# Patient Record
Sex: Female | Born: 1975 | Race: Black or African American | Hispanic: No | Marital: Single | Smoking: Current every day smoker
Health system: Southern US, Community
[De-identification: ages and names within clinical notes are randomized; demographics above are authoritative.]

## PROBLEM LIST (undated history)

## (undated) DIAGNOSIS — I10 Essential (primary) hypertension: Secondary | ICD-10-CM

## (undated) DIAGNOSIS — I5032 Chronic diastolic (congestive) heart failure: Secondary | ICD-10-CM

## (undated) DIAGNOSIS — D509 Iron deficiency anemia, unspecified: Secondary | ICD-10-CM

## (undated) DIAGNOSIS — E785 Hyperlipidemia, unspecified: Secondary | ICD-10-CM

## (undated) DIAGNOSIS — N939 Abnormal uterine and vaginal bleeding, unspecified: Secondary | ICD-10-CM

## (undated) DIAGNOSIS — D500 Iron deficiency anemia secondary to blood loss (chronic): Secondary | ICD-10-CM

## (undated) DIAGNOSIS — M1990 Unspecified osteoarthritis, unspecified site: Secondary | ICD-10-CM

## (undated) DIAGNOSIS — H3322 Serous retinal detachment, left eye: Secondary | ICD-10-CM

## (undated) DIAGNOSIS — R000 Tachycardia, unspecified: Secondary | ICD-10-CM

## (undated) DIAGNOSIS — H5462 Unqualified visual loss, left eye, normal vision right eye: Secondary | ICD-10-CM

## (undated) DIAGNOSIS — E669 Obesity, unspecified: Secondary | ICD-10-CM

## (undated) DIAGNOSIS — N921 Excessive and frequent menstruation with irregular cycle: Secondary | ICD-10-CM

## (undated) DIAGNOSIS — I3139 Other pericardial effusion (noninflammatory): Secondary | ICD-10-CM

## (undated) HISTORY — DX: Chronic diastolic (congestive) heart failure: I50.32

## (undated) HISTORY — DX: Serous retinal detachment, left eye: H33.22

## (undated) HISTORY — DX: Obesity, unspecified: E66.9

## (undated) HISTORY — DX: Essential (primary) hypertension: I10

## (undated) HISTORY — DX: Other pericardial effusion (noninflammatory): I31.39

## (undated) HISTORY — DX: Iron deficiency anemia, unspecified: D50.9

## (undated) HISTORY — PX: EYE SURGERY: SHX253

## (undated) HISTORY — DX: Hyperlipidemia, unspecified: E78.5

---
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
## 2005-09-05 ENCOUNTER — Other Ambulatory Visit: Admission: RE | Admit: 2005-09-05 | Discharge: 2005-09-05 | Payer: Self-pay | Admitting: Obstetrics and Gynecology

## 2006-01-07 ENCOUNTER — Inpatient Hospital Stay (HOSPITAL_COMMUNITY): Admission: AD | Admit: 2006-01-07 | Discharge: 2006-01-08 | Payer: Self-pay | Admitting: Obstetrics and Gynecology

## 2006-01-22 ENCOUNTER — Inpatient Hospital Stay (HOSPITAL_COMMUNITY): Admission: AD | Admit: 2006-01-22 | Discharge: 2006-01-24 | Payer: Self-pay | Admitting: Obstetrics and Gynecology

## 2006-01-23 ENCOUNTER — Encounter (INDEPENDENT_AMBULATORY_CARE_PROVIDER_SITE_OTHER): Payer: Self-pay | Admitting: *Deleted

## 2006-01-23 HISTORY — PX: TUBAL LIGATION: SHX77

## 2007-05-11 ENCOUNTER — Emergency Department (HOSPITAL_COMMUNITY): Admission: EM | Admit: 2007-05-11 | Discharge: 2007-05-11 | Payer: Self-pay | Admitting: Family Medicine

## 2010-04-22 ENCOUNTER — Emergency Department (HOSPITAL_COMMUNITY)
Admission: EM | Admit: 2010-04-22 | Discharge: 2010-04-22 | Payer: Self-pay | Source: Home / Self Care | Admitting: Family Medicine

## 2010-10-08 NOTE — H&P (Signed)
NAMEODETTE, WATANABE            ACCOUNT NO.:  [id number]   MEDICAL RECORD NO.:  [id number]          PATIENT TYPE:  INP   LOCATION:  9166                          F[facility]ILITY:  [facility]   PHYSICIAN:  Janine Limbo, M.D.DATE OF BIRTH:  03-30-1976   DATE OF ADMISSION:  01/22/2006  DATE OF DISCHARGE:                                HISTORY & PHYSICAL   Cheyenne [name] is a 35 year old, gravida 5, para 2-0-3-2, at [redacted] weeks  gestation, EDD January 14, 2006, as determined by dates and confirmed with  second trimester ultrasound.  She presents with questionable rupture of  membranes, possibly last p.m. for clear fluid.  Her contractions have been  increasing in frequency and intensity throughout the day today. She reports  positive fetal movement.  No vaginal bleeding and denies any headache,  visual changes or epigastric pain.  Her pregnancy has been followed by the  C.N.M. service at [facility] and is remarkable for:  1. Obesity.  2. History of a heart murmur with no SBE prophylaxis.  3. History of migraines.  4. Tobacco use.  5. Late to prenatal care.  6. Group B strep negative.   This patient initiated prenatal care at the office of [facility] on September 05, 2005, at [redacted] weeks gestation. Her pregnancy has been  essentially unremarkable. She has been size greater than dates in the third  trimester. Ultrasound at 36 weeks found estimated fetal weight at the 61.3%,  AFI 15.11 cm, vertex.  She has been normotensive throughout her pregnancy  with no proteinuria.   At her 21-week ultrasound, an increased heart axis angle was noted, and  patient did have fetal echocardiogram done at [facility]. This was found to be  normal.   PRENATAL LAB WORD:  On September 05, 2005, hemoglobin and hematocrit 10.3 and  31.7, respectively, platelets 358,000.  Blood type and Rh O+, antibody  screen negative, VDRL nonreactive, rubella immune, hepatitis B surface  antigen negative, HIV  nonreactive.  CF testing negative. At 28 weeks,1-hour  glucose challenge 90, RPR negative.  Pap smear within normal limits.  Hemoglobin 10.5. At 36 weeks, culture of the vaginal tract is negative for  group B strep.   OB HISTORY:  In 1996, the patient had a first trimester SAB with no D&C, no  complications.  In 1997, the patient had a first trimester SAB with no D&C  and no complications. In 1998, the patient had and first trimester SAB with  no D&C and no complications. In the year 2000, the patient had a normal  spontaneous vaginal delivery at 42 weeks with the birth of a 7 pound 14  ounce female infant in [place] with no complications. In 2001, the  patient had a normal spontaneous vaginal delivery with the birth of a 6  pound 6 ounce female infant with no complications in [place].   The patient has no known drug allergies.   Her medical history is significant for heart murmur, migraines and obesity.  The patient does smoke cigarettes and denies the use of alcohol or illicit  drugs with pregnancy.  The patient had eye surgery at age 85. She is adopted  and does not know any family history.   GENETIC HISTORY:  The patient does not name the father of the baby and does  not know any of the father-of-the-baby's history. As far as the history she  knows, there is no genetic history of familial or chromosomal disorders.   SOCIAL HISTORY:  Cheyenne [name] is a 35 year old gravida 5, para 2-0-3-2. She  does not name the father of the baby.   REVIEW OF SYSTEMS:  There are no signs or symptoms suggestive of focal or  systemic disease, and the patient is typical of one with a uterine pregnancy  at term with premature rupture of membranes in early labor.   PHYSICAL EXAMINATION:  VITAL SIGNS: Are stable.  The patient is afebrile.  HEENT is unremarkable.  HEART: Regular rate and rhythm.  LUNGS: Are clear.  ABDOMEN: Is soft and nontender.  It is gravid in its contour. Uterine fundus   is noted to extend 40 cm above the level of the pubic symphysis.  Leopold's  maneuvers finds the infant to be in longitudinal lie, cephalic presentation,  and the estimated fetal weight is 7-1/2 pounds. The baseline of the fetal  heart rate monitor is 140s with average long-term variability. Reactivity is  present.  There are mild variable decelerations noted.  PELVIC:  Sterile speculum exam finds positive pooling, positive Nitrazine,  positive fern. Digital exam of the cervix finds it to be 3 cm dilated, 80%  effaced, with a bulging fore bag and the vertex at a -2 station.  EXTREMITIES: Show no pathologic edema.  DTRs are 1+ with no clonus.  There  is no calf tenderness noted bilaterally.   ASSESSMENT:  1. Intrauterine pregnancy at 41 weeks.  2. Premature rupture of membranes.  3. Early labor.   PLAN:  1. Admit for per Dr. Marline Backbone.  2. Routine C.N.M. orders.  3. The patient may have epidural as desired for labor pain.      Rica Koyanagi, C.N.M.      Janine Limbo, M.D.  Electronically Signed    SDM/MEDQ  D:  01/22/2006  T:  01/22/2006  Job:  [id number]

## 2010-10-08 NOTE — Op Note (Signed)
[name], [name]            ACCOUNT NO.:  [id number]   MEDICAL RECORD NO.:  [id number]          PATIENT TYPE:  INP   LOCATION:  9105                          [facility]ACILITY:  [facility]   PHYSICIAN:  Janine Limbo, M.D.DATE OF BIRTH:  January 05, 1976   DATE OF PROCEDURE:  DATE OF DISCHARGE:                                 OPERATIVE REPORT   PREOPERATIVE DIAGNOSES:  1. Postpartum day zero.  2. Desires sterilization.   POSTOPERATIVE DIAGNOSES:  1. Postpartum day zero.  2. Desires sterilization.   PROCEDURE:  Postpartum bilateral tubal ligation.   SURGEON:  Leonard Schwartz, M.D.   ANESTHESIA:  Epidural.   DISPOSITION:  Ms. Cheyenne [name] is a 35 year old female, now para 3-0-3-3, who  had a vaginal delivery of a healthy female infant today.  She desires  sterilization.  She understands the indications for her surgical procedure  and she accepts the risk of, but not limited to, anesthetic complications,  bleeding, infection, possible damage to the surrounding organs, and possible  tubal failure (17 per 1000).   FINDINGS:  The fallopian tubes were normal bilaterally.  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   PROCEDURE:  The patient was taken to the operating room where it was  determined that the epidural she had received for labor would be adequate  for a tubal ligation.  The patient's abdomen was prepped with multiple  layers of Betadine and then sterilely draped.  The subumbilical area was  injected with 10 mL of 0.5% Marcaine with epinephrine.  A subumbilical  incision was made and carried sharply through the subcutaneous tissue,  fascia, and the anterior peritoneum.  The level of the tube was identified  and followed to its fimbriated end.  A knuckle of tube was made on the left  using a free tie and then two suture ligatures of 0-plain catgut.  The  knuckle of tube thus made was excised.  Hemostasis was adequate.  An  identical procedure was carried  out on the opposite side.  Again, hemostasis  was adequate.  The hydatid cyst of Morgagni was removed using the Bovie  cautery.  Hemostasis was confirmed throughout.  All instruments were  removed.  The anterior peritoneum and the fascia were closed using a running  suture of 2-0 Vicryl.  The skin was reapproximated using a subcuticular  suture of 4-0 Monocryl.  Sponge, needle  and instrument counts were correct on two occasions.  The estimated blood  loss for the procedure was 2 mL.  The patient tolerated her procedure well.  The patient was taken to the recovery room in stable condition.  The  portions of the fallopian tubes were sent to pathology for evaluation.      Janine Limbo, M.D.  Electronically Signed     AVS/MEDQ  D:  01/23/2006  T:  01/23/2006  Job:  [id number]

## 2010-10-08 NOTE — Discharge Summary (Signed)
Cheyenne [name], Cheyenne [name]            ACCOUNT NO.:  [id number]   MEDICAL RECORD NO.:  [id number]          PATIENT TYPE:  INP   LOCATION:  9105                          [facility]ACILITY:  [facility]   PHYSICIAN:  Osborn Coho, M.D.   DATE OF BIRTH:  [birth year]   DATE OF ADMISSION:  01/22/2006  DATE OF DISCHARGE:  01/24/2006                                 DISCHARGE SUMMARY   ADMITTING DIAGNOSES:  1. Intrauterine pregnancy at 41 weeks.  2. Premature rupture of membranes in early labor.  3. Negative group B Strep.   DISCHARGE DIAGNOSES:  1. Intrauterine pregnancy at term.  2. Desired sterilization.   PROCEDURE:  1. Spontaneous vaginal birth.  2. Bilateral tubal sterilization.  3. Epidural anesthesia.   HOSPITAL COURSE:  Ms. Cheyenne [name] is a 35 year old gravida 5, para 2-0-3-2 at  63 weeks, who presented in early labor on the afternoon of January 22, 2006. Her pregnancy had been remarkable for:  1. Obesity.  2. History of heart murmur, but no bacterial endocarditis prophylaxis      required,  3. History of migraines.  4. Tobacco use.  5. Late to perinatal care.  6. Group B Strep negative.   On admission, cervix was 3, 80%, vertex and -2 station with some forewaters  noted. An epidural was placed. Artificial rupture of membranes with forebag  was noted with clear fluid noted. She did have some moderate variables.  Amnioinfusion was initiated. The variables did have some persistence, but no  late decelerations were noted. An IUPC and scalp lead were placed. The  patient continued to make change. She was completely dilated at 12:55 a.m.  on the morning of September 3. She delivered at 1:17 a.m. a viable female,  weight 8 pounds 7 ounces, Apgars were 9 and 9. There was a shoulder cord  noted at delivery. There were no lacerations noted. The patient desired a  tubal. This was done on the morning of January 23, 2006. Infant had gone to  the full term nursery and mother's recovery was accomplishing  without  difficulty. The patient tolerated the procedure well. By postpartum day #1,  the patient was doing well. She was up ad lib. She was tolerating a regular  diet. She was bottle feeding. Hemoglobin was 9.3, down from 10.5. White  blood cell count 16.8, up from 12.5 and platelets were 283, down from 313.  She was deemed to receive full benefit of her hospital stay and was  discharged home.   DISCHARGE MEDICATIONS:  Discharge instructions per [facility]  handout.   DISCHARGE MEDICATIONS:  1. Motrin 600 mg p.o. q. 6 h. p.r.n. pain.  2. Tylox 1-2 p.o. q. 3-4 h. p.r.n. pain.   FOLLOWUP:  Discharge followup will occur in 6 weeks at [facility].      [name], C.N.M.      Osborn Coho, M.D.  Electronically Signed    VLL/MEDQ  D:  01/24/2006  T:  01/24/2006  Job:  [id number]

## 2011-01-16 ENCOUNTER — Inpatient Hospital Stay (INDEPENDENT_AMBULATORY_CARE_PROVIDER_SITE_OTHER)
Admission: RE | Admit: 2011-01-16 | Discharge: 2011-01-16 | Disposition: A | Discharge status: Home / Self Care | Payer: Self-pay | Source: Ambulatory Visit | Attending: Family Medicine | Admitting: Family Medicine

## 2011-01-16 DIAGNOSIS — T6391XA Toxic effect of contact with unspecified venomous animal, accidental (unintentional), initial encounter: Secondary | ICD-10-CM

## 2013-05-23 HISTORY — PX: EYE SURGERY: SHX253

## 2015-05-24 ENCOUNTER — Emergency Department (HOSPITAL_COMMUNITY)
Admission: EM | Admit: 2015-05-24 | Discharge: 2015-05-24 | Disposition: A | Discharge status: Home / Self Care | Payer: Managed Care, Other (non HMO) | Attending: Emergency Medicine | Admitting: Emergency Medicine

## 2015-05-24 ENCOUNTER — Emergency Department (HOSPITAL_COMMUNITY): Payer: Managed Care, Other (non HMO)

## 2015-05-24 ENCOUNTER — Encounter (HOSPITAL_COMMUNITY): Payer: Self-pay

## 2015-05-24 DIAGNOSIS — Y998 Other external cause status: Secondary | ICD-10-CM | POA: Insufficient documentation

## 2015-05-24 DIAGNOSIS — Y9289 Other specified places as the place of occurrence of the external cause: Secondary | ICD-10-CM | POA: Diagnosis not present

## 2015-05-24 DIAGNOSIS — S80212A Abrasion, left knee, initial encounter: Secondary | ICD-10-CM | POA: Diagnosis not present

## 2015-05-24 DIAGNOSIS — Z87891 Personal history of nicotine dependence: Secondary | ICD-10-CM | POA: Diagnosis not present

## 2015-05-24 DIAGNOSIS — Y9389 Activity, other specified: Secondary | ICD-10-CM | POA: Diagnosis not present

## 2015-05-24 DIAGNOSIS — S93401A Sprain of unspecified ligament of right ankle, initial encounter: Secondary | ICD-10-CM | POA: Diagnosis not present

## 2015-05-24 DIAGNOSIS — W1839XA Other fall on same level, initial encounter: Secondary | ICD-10-CM | POA: Diagnosis not present

## 2015-05-24 DIAGNOSIS — T148XXA Other injury of unspecified body region, initial encounter: Secondary | ICD-10-CM

## 2015-05-24 DIAGNOSIS — S99911A Unspecified injury of right ankle, initial encounter: Secondary | ICD-10-CM | POA: Diagnosis present

## 2015-05-24 MED ORDER — NAPROXEN 500 MG PO TABS: 500.0000 mg | ORAL_TABLET | Freq: Two times a day (BID) | ORAL | Status: DC

## 2015-05-24 NOTE — Discharge Instructions (Signed)
Ankle Sprain °An ankle sprain is an injury to the strong, fibrous tissues (ligaments) that hold the bones of your ankle joint together.  °CAUSES °An ankle sprain is usually caused by a fall or by twisting your ankle. Ankle sprains most commonly occur when you step on the outer edge of your foot, and your ankle turns inward. People who participate in sports are more prone to these types of injuries.  °SYMPTOMS  °· Pain in your ankle. The pain may be present at rest or only when you are trying to stand or walk. °· Swelling. °· Bruising. Bruising may develop immediately or within 1 to 2 days after your injury. °· Difficulty standing or walking, particularly when turning corners or changing directions. °DIAGNOSIS  °Your caregiver will ask you details about your injury and perform a physical exam of your ankle to determine if you have an ankle sprain. During the physical exam, your caregiver will press on and apply pressure to specific areas of your foot and ankle. Your caregiver will try to move your ankle in certain ways. An X-ray exam may be done to be sure a bone was not broken or a ligament did not separate from one of the bones in your ankle (avulsion fracture).  °TREATMENT  °Certain types of braces can help stabilize your ankle. Your caregiver can make a recommendation for this. Your caregiver may recommend the use of medicine for pain. If your sprain is severe, your caregiver may refer you to a surgeon who helps to restore function to parts of your skeletal system (orthopedist) or a physical therapist. °HOME CARE INSTRUCTIONS  °· Apply ice to your injury for 1-2 days or as directed by your caregiver. Applying ice helps to reduce inflammation and pain. °· Put ice in a plastic bag. °· Place a towel between your skin and the bag. °· Leave the ice on for 15-20 minutes at a time, every 2 hours while you are awake. °· Only take over-the-counter or prescription medicines for pain, discomfort, or fever as directed by  your caregiver. °· Elevate your injured ankle above the level of your heart as much as possible for 2-3 days. °· If your caregiver recommends crutches, use them as instructed. Gradually put weight on the affected ankle. Continue to use crutches or a cane until you can walk without feeling pain in your ankle. °· If you have a plaster splint, wear the splint as directed by your caregiver. Do not rest it on anything harder than a pillow for the first 24 hours. Do not put weight on it. Do not get it wet. You may take it off to take a shower or bath. °· You may have been given an elastic bandage to wear around your ankle to provide support. If the elastic bandage is too tight (you have numbness or tingling in your foot or your foot becomes cold and blue), adjust the bandage to make it comfortable. °· If you have an air splint, you may blow more air into it or let air out to make it more comfortable. You may take your splint off at night and before taking a shower or bath. Wiggle your toes in the splint several times per day to decrease swelling. °SEEK MEDICAL CARE IF:  °· You have rapidly increasing bruising or swelling. °· Your toes feel extremely cold or you lose feeling in your foot. °· Your pain is not relieved with medicine. °SEEK IMMEDIATE MEDICAL CARE IF: °· Your toes are numb or blue. °·   You have severe pain that is increasing. MAKE SURE YOU:   Understand these instructions.  Will watch your condition.  Will get help right away if you are not doing well or get worse.   This information is not intended to replace advice given to you by your health care provider. Make sure you discuss any questions you have with your health care provider.   Document Released: 05/09/2005 Document Revised: 05/30/2014 Document Reviewed: 05/21/2011 Elsevier Interactive Patient Education 2016 [employer]  Emergency Department Resource Guide 1) Find a Doctor and Pay Out of Pocket Although you won't have to find out who  is covered by your insurance plan, it is a good idea to ask around and get recommendations. You will then need to call the office and see if the doctor you have chosen will accept you as a new patient and what types of options they offer for patients who are self-pay. Some doctors offer discounts or will set up payment plans for their patients who do not have insurance, but you will need to ask so you aren't surprised when you get to your appointment.  2) Contact Your Local Health Department Not all health departments have doctors that can see patients for sick visits, but many do, so it is worth a call to see if yours does. If you don't know where your local health department is, you can check in your phone book. The CDC also has a tool to help you locate your state's health department, and many state websites also have listings of all of their local health departments.  3) Find a Walk-in Clinic If your illness is not likely to be very severe or complicated, you may want to try a walk in clinic. These are popping up all over the country in pharmacies, drugstores, and shopping centers. They're usually staffed by nurse practitioners or physician assistants that have been trained to treat common illnesses and complaints. They're usually fairly quick and inexpensive. However, if you have serious medical issues or chronic medical problems, these are probably not your best option.  No Primary Care Doctor: - Call Health Connect at  [phone] - they can help you locate a primary care doctor that  accepts your insurance, provides certain services, etc. - Physician Referral Service- [phone]  Chronic Pain Problems: Organization         Address  Phone   Notes  [facility]  [phone] Patients need to be referred by their primary care doctor.   Medication Assistance: Organization         Address  Phone   Notes  [facility] Medication [facility] [address]  [address] [place], [place] [address] [phone]) [phone] --Must be a resident of [place] [facility] -- Must have NO insurance coverage whatsoever (no Medicaid/ Medicare, etc.) -- The pt. MUST have a primary care doctor that directs their care regularly and follows them in the community   MedAssist  [phone]   [employer]  [phone]    Agencies that provide inexpensive medical care: Organization         Address  Phone   Notes  [facility]  [phone]) [phone]   [facility]    [phone]   [facility] [address] [place], [place] [address] [phone]) [phone]   [facility] [address], [place] [phone]   [facility]anned Parenthood    [phone]) [phone]  [facility]    [phone]   [facility]  [address], [place] Phone:  [phone](336) [phone], Fax:  [phone](336) [phone] Hours of Operation:  9 am - 6 pm, M-F.  Also accepts Medicaid/Medicare and self-pay.  [facility]  [address], [place] Phone: [phone], Fax: [phone]) [phone]. Hours of Operation:  8:30 am - 5:30 pm, M-F.  Also accepts Medicaid and self-pay.  [facility] [place] [address], [place] Phone: [phone]) [phone]   [facility] [address], [place], [place] [phone], Ext. [phone] Mondays & Thursdays: 7-9 AM.  First 15 patients are seen on a first come, first serve basis.    Medicaid-accepting [facility] Providers:  Organization         Address  Phone   Notes  [facility] [address], [place] [phone]) [phone] Also accepts self-pay patients.  [facility] [address] [place], [place]  [phone]) [phone]   [facility] [address], [address], [place] [phone]) [phone]   [facility] [address] [place] [address], [place] [phone]) [phone]   Renaye RakersVeita Bland [address], [place]   [phone]) [phone] Only accepts [place] Access [place] patients after they have their name applied to their card.   Self-Pay (no insurance) in [facility] [place]:  Organization         Address  Phone   Notes  Sickle Cell Patients, [facility] [address], [place] [phone]) [phone]   [facility] [address], [place] [phone]) [phone]   [facility] [place]  [address], [place] [phone]   [facility]/Dr. Osei-Bonsu  [address] [place] [address], [place] or [phone], [place] [phone] Phone number for both [place] and [place] locations is the same.  [facility] [address], [place] [phone]) [phone]   [facility] [place] [address] [place] [address], [place] or [address] [phone]) [phone]   [facility] [address], [place] [phone]) [phone], phone; [phone](336) [phone], fax Sees patients 1st and 3rd Saturday of every month.  Must not qualify for public or private insurance (i.e. Medicaid, Medicare, [place] Health Choice, Veterans' Benefits)  Household income should be no more than 200% of the poverty level The clinic cannot treat you if you are pregnant or think you are pregnant  Sexually transmitted diseases are not treated at the clinic.    Dental Care: Organization         Address  Phone  Notes  [facility] Department of [facility] [address], [place] [phone]) [phone] Accepts children up to age 40 who are enrolled in [place]icaid or [place] Health Choice; pregnant women with a Medicaid card; and children who have applied for Medicaid or [place] Health Choice, but were declined, whose parents can pay a reduced fee at time of service.  [facility] Department of [facility] [place]  [address], [place] [phone] Accepts children up to age 221 who are  enrolled in [place] or [place] Health Choice; pregnant women with a Medicaid card; and children who have applied for Medicaid or  Health Choice, but were declined, whose parents can pay a reduced fee at time  of service.  [facility]  [address] [place], [place] [phone] Patients are seen by appointment only. Walk-ins are not accepted. [facility] will see patients 47 years of age and older. Monday - Tuesday (8am-5pm) Most Wednesdays (8:30-5pm) $30 per visit, cash only  [facility] Adult Dental Access PROGRAM  [address], [facility] [phone] Patients are seen by appointment only. Walk-ins are not accepted. [facility] will see patients 28 years of age and older. One Wednesday Evening (Monthly: Volunteer Based).  $30 per visit, cash only  [employer]  [phone] for adults; Children under age 62, call Graduate Pediatric Dentistry at [phone]. Children aged 42-14, please call [phone] to request a pediatric application.  Dental services are provided in all areas of dental care including fillings, crowns and bridges, complete and partial dentures, implants, gum treatment, root canals, and extractions. Preventive care is also provided. Treatment is provided to both adults and children. Patients are selected via a lottery and there is often a waiting list.   [facility] [address] [place]  [phone] [email or web address]   [facility] [address] [place], [place] [phone], Ext. [phone] Second and Fourth Thursday of each month, opens at 6:30 AM; Clinic ends at 9 AM.  Patients are seen on a first-come first-served basis, and a limited number are seen during each clinic.   [facility]  [address] Ether Griffins [place], [place] [phone]   Eligibility Requirements You must have lived in [place], [place], or [place] counties for at least the last three months.   You  cannot be eligible for state or federal sponsored [place], including [employer], [place], or [employer].   You generally cannot be eligible for healthcare insurance through your employer.    How to apply: Eligibility screenings are held every Tuesday and Wednesday afternoon from 1:00 pm until 4:00 pm. You do not need an appointment for the interview!  [facility] [address] [place], [place] [phone]   [facility] Health Department  [phone]   [facility] Health Department  [phone]   [facility] Health Department  [phone]    Behavioral Health Resources in the Community: Intensive Outpatient Programs Organization         Address  Phone  Notes  [facility] [address] [place], [place] [phone]   [facility] [address] [place], [place] [phone]   ADS: Alcohol & Drug Svcs [address], [place], [place]  [phone]   [facility] Mental Health [address]  [place], [place] [phone] or [phone]   Substance Abuse Resources Organization         Address  Phone  Notes  [facility] Drug Services  [phone]   [facility]  8637098906   [facility]  [phone]   Floydene Flock  [phone]   Residential & Outpatient Substance Abuse Program  [phone]   Psychological Services Organization         Address  Phone  Notes  [facility]al Health  [phone]   [facility]  [phone]   [facility] Mental Health [address], [place] [phone] or [phone]    Mobile Crisis Teams Organization         Address  Phone  Notes  [facility], Mobile Crisis Care Unit  [phone]   [facility]  [address] [place], [place] [phone]   [facility] DeEsch [address] [place] [phone]    Self-Help/Support  Groups Organization         Address  Phone             Notes  [facility] [place] - variety of support groups  336- [phone] Call for more information  Narcotics Anonymous (NA), Caring Services [address], [place]  2 meetings at this location   Statisticianesidential Treatment Programs Organization         Address  Phone  Notes  [facility] [address],    [place]  [phone]   [facility]  [address], [place] [phone], [place], [place] [phone]   [facility] [address], [place] [phone] Admissions: 8am-3pm M-F  [facility] [address]    [place], [place] [phone]   [facility] [address], [place], [place] [phone]   [facility] [address]  [place], [place] [phone]   [facility] - Intensive Outpatient [address], [place], [place] [phone]   [facility] ([facility] [address]  [place], [place] [phone] or [phone]   [facility] (RT[facility]) [address] [place], [place] [phone] Accepts Medicaid  [facility] [address]  [place] [phone] Substance Abuse/Addiction Treatment   [facility] Behavioral Health Resources Organization         Address  Phone  Notes  [facility]  [phone]) [phone]   Angie FavaJulie Brannon, PhD [address], [address] [place], [place]   [phone]) [phone] or [phone](336) [phone]   [facility] [place]   [address] [place], [place] [phone](336) [phone]   [facility] [address], [place], [place] [phone](336) [phone] Insurance/Medicaid/sponsorship through [facility]  [facility] Families [address]                                    [place], [place] [phone]) [phone] Therapy/tele-psych/case  [facility] [address] [place].   [place], [place] [phone](336) [phone]    Dr. Lolly MustacheArfeen  [phone](336) [phone]   [facility] [place]  [facility]  [employer] [facility] 1) [address], [place] 2) [address], [place] 3)  [address], [place] [phone]) [phone]  [phone]) [phone]   [facility] Child Abuse Hotline [phone](336) [phone] or [phone] (After Hours)

## 2015-05-24 NOTE — ED Provider Notes (Signed)
CSN: [id number]     Arrival date & time 05/24/15  1134 History  By signing my name below, I, Tanda RockersMargaux Venter, attest that this documentation has been prepared under the direction and in the presence of Cheri FowlerKayla Anaiza Behrens, PA-C. Electronically Signed: Tanda RockersMargaux Venter, ED Scribe. 05/24/2015. 12:09 PM.   Chief Complaint  Patient presents with  . Ankle Pain   The history is provided by the patient. No language interpreter was used.     HPI Comments: Cheyenne [name] is a 40 y.o. female who presents to the Emergency Department complaining of gradual onset, constant, mild, right ankle pain and swelling s/p ground level fall that occurred on 05/15/2015 (approximately 10 days ago). Pt believes she rolled her ankle, causing the pain. She also complains of mild left knee pain from an abrasion that occurred during the fall. No head injury or LOC. She has been taking Ibuprofen without relief. Denies numbness, weakness, tingling, color change, or any other associated symptoms.   History reviewed. No pertinent past medical history. Past Surgical History  Procedure Laterality Date  . Eye surgery     No family history on file. Social History  Substance Use Topics  . Smoking status: Former Games developermoker  . Smokeless tobacco: None  . Alcohol Use: Yes     Comment: occasionally    OB History    No data available     Review of Systems  Musculoskeletal: Positive for joint swelling and arthralgias (Right ankle. Left knee. ).  Skin: Negative for color change.  Neurological: Negative for weakness.  All other systems reviewed and are negative.  Allergies  Review of patient's allergies indicates no known allergies.  Home Medications   Prior to Admission medications   Medication Sig Start Date End Date Taking? Authorizing Provider  naproxen (NAPROSYN) 500 MG tablet Take 1 tablet (500 mg total) by mouth 2 (two) times daily. 05/24/15   Cheri FowlerKayla Zona Pedro, PA-C   Triage Vitals: BP 134/100 mmHg  Pulse 80  Temp(Src) 98 F (36.7  C) (Oral)  Resp 20  SpO2 99%  LMP 05/24/2015 (Approximate)   Physical Exam  Constitutional: She is oriented to person, place, and time. She appears well-developed and well-nourished. No distress.  HENT:  Head: Normocephalic and atraumatic.  Eyes: Conjunctivae and EOM are normal.  Neck: Neck supple. No tracheal deviation present.  Cardiovascular: Intact distal pulses.   Pulses:      Dorsalis pedis pulses are 2+ on the right side, and 2+ on the left side.  Pulmonary/Chest: Effort normal. No respiratory distress.  Musculoskeletal: Normal range of motion. She exhibits tenderness.       Right knee: Normal.       Left knee: She exhibits normal range of motion, no swelling, no effusion, no ecchymosis, no deformity, no laceration, no erythema, normal alignment, no LCL laxity and normal patellar mobility. No tenderness found.       Right ankle: She exhibits swelling (mild). She exhibits normal range of motion, no ecchymosis, no deformity, no laceration and normal pulse. Tenderness. CF ligament tenderness found.       Left ankle: Normal.       Legs: Compartments are soft and compressible.   Neurological: She is alert and oriented to person, place, and time.  Strength and sensation intact bilaterally throughout lower extremities.  Gait normal without ataxia.   Skin: Skin is warm and dry. Abrasion (anterior left knee) noted. No bruising, no ecchymosis and no laceration noted.  Well healing abrasion to anterior left knee  with no signs of infection.  Psychiatric: She has a normal mood and affect. Her behavior is normal.  Nursing note and vitals reviewed.   ED Course  Procedures (including critical care time)  DIAGNOSTIC STUDIES: Oxygen Saturation is 99% on RA, normal by my interpretation.    COORDINATION OF CARE: 12:08 PM-Discussed treatment plan which includes Rx Naproxen and brace with pt at bedside and pt agreed to plan.   Labs Review Labs Reviewed - No data to display  Imaging  Review Dg Ankle Complete Right  05/24/2015  CLINICAL DATA:  Ankle pain.  Fall 9 days ago. EXAM: RIGHT ANKLE - COMPLETE 3+ VIEW COMPARISON:  None. FINDINGS: No acute fracture.  No dislocation.  Unremarkable soft tissues. IMPRESSION: No acute bony pathology. Electronically Signed   By: Jolaine Click M.D.   On: 05/24/2015 12:02   I have personally reviewed and evaluated these images as part of my medical decision-making.   EKG Interpretation None      MDM   Final diagnoses:  Abrasion  Ankle sprain, right, initial encounter   Patient presents with right ankle pain after rolling it 05/15/15.  Ambulatory.  No numbness, tingling, or weakness.  VSS, NAD.  On exam, mild swelling to right ankle.  Mild TTP.  NVI.  Well healing abrasion on right knee without signs of infection.  Right knee non-TTP.  Compartments are soft and compressible.  Plain films negative.  Will apply ASO brace and naprosyn for pain.  Evaluation does not show pathology requiring ongoing emergent intervention or admission. Pt is hemodynamically stable and mentating appropriately. Discussed findings/results and plan with patient/guardian, who agrees with plan. All questions answered. Return precautions discussed and outpatient follow up given.    I personally performed the services described in this documentation, which was scribed in my presence. The recorded information has been reviewed and is accurate.      Cheri Fowler, PA-C 05/24/15 1233  Linwood Dibbles, MD 05/24/15 [phone]

## 2015-05-24 NOTE — ED Notes (Signed)
Pt presents with c/o right ankle injury. Pt reports that she fell on 12/23 and has been having pain off and on in that ankle since then. Ambulatory to triage.

## 2016-03-22 ENCOUNTER — Encounter: Payer: Self-pay | Admitting: Internal Medicine

## 2016-03-22 ENCOUNTER — Ambulatory Visit (INDEPENDENT_AMBULATORY_CARE_PROVIDER_SITE_OTHER): Payer: BC Managed Care – PPO | Admitting: Internal Medicine

## 2016-03-22 VITALS — BP 120/90 | HR 99 | Temp 98.2°F | Resp 16 | Ht 66.0 in | Wt 227.0 lb

## 2016-03-22 DIAGNOSIS — F17218 Nicotine dependence, cigarettes, with other nicotine-induced disorders: Secondary | ICD-10-CM

## 2016-03-22 DIAGNOSIS — Z23 Encounter for immunization: Secondary | ICD-10-CM | POA: Diagnosis not present

## 2016-03-22 DIAGNOSIS — Z0001 Encounter for general adult medical examination with abnormal findings: Secondary | ICD-10-CM | POA: Diagnosis not present

## 2016-03-22 DIAGNOSIS — Z0000 Encounter for general adult medical examination without abnormal findings: Secondary | ICD-10-CM

## 2016-03-22 DIAGNOSIS — F17200 Nicotine dependence, unspecified, uncomplicated: Secondary | ICD-10-CM | POA: Insufficient documentation

## 2016-03-22 DIAGNOSIS — R51 Headache: Secondary | ICD-10-CM | POA: Diagnosis not present

## 2016-03-22 DIAGNOSIS — R519 Headache, unspecified: Secondary | ICD-10-CM

## 2016-03-22 DIAGNOSIS — Z720 Tobacco use: Secondary | ICD-10-CM | POA: Insufficient documentation

## 2016-03-22 MED ORDER — VARENICLINE TARTRATE 0.5 MG X 11 & 1 MG X 42 PO MISC: ORAL | 0 refills | Status: DC

## 2016-03-22 MED ORDER — ASPIRIN-ACETAMINOPHEN-CAFFEINE 250-250-65 MG PO TABS: 1.0000 | ORAL_TABLET | Freq: Four times a day (QID) | ORAL | 0 refills | Status: DC | PRN

## 2016-03-22 NOTE — Assessment & Plan Note (Signed)
More than three minutes spent discussed smoking cessation Discussed options for quitting, including nicotine replacement, wellbutrin/chantix, and natural options Discussed the importance of breaking her habits associated with smoking Will try chantix - discussed possible side effects including headaches - this may make her headaches worse -- she still wants to try it Start starter pak - if tolerated will continue for at least three months

## 2016-03-22 NOTE — Progress Notes (Signed)
Pre visit review using our clinic review tool, if applicable. No additional management support is needed unless otherwise documented below in the visit note. 

## 2016-03-22 NOTE — Assessment & Plan Note (Signed)
Five minutes spent discussed smoking cessation Discussed options for quitting, including nicotine replacement, wellbutrin/chantix, and natural options Discussed the importance of breaking her habits associated with smoking Will try chantix - discussed possible side effects including headaches - this may make her headaches worse -- she still wants to try it Start starter pak - if tolerated will continue for at least three months

## 2016-03-22 NOTE — Assessment & Plan Note (Signed)
Daily headaches for years Taking 3 excedrin daily for a very long time Has been told she has migraines, but not sure if she truly has migraines May be experiencing rebound headaches Will hold off on imaging Will refer to neuro Advised her to try to come off excedrin but this will be difficult for her and will wait until she sees neuro

## 2016-03-22 NOTE — Patient Instructions (Addendum)
Test(s) ordered today. Your results will be released to [facility] (or called to you) after review, usually within 72hours after test completion. If any changes need to be made, you will be notified at that same time.  All other Health Maintenance issues reviewed.   All recommended immunizations and age-appropriate screenings are up-to-date or discussed.  Tetanus immunization administered today.   Medications reviewed and updated.  Changes include: chantix for smoking.   Your prescription(s) have been submitted to your pharmacy. Please take as directed and contact our office if you believe you are having problem(s) with the medication(s).  A referral was ordered for neurology.    Varenicline oral tablets What is this medicine? VARENICLINE (var EN i kleen) is used to help people quit smoking. It can reduce the symptoms caused by stopping smoking. It is used with a patient support program recommended by your physician. This medicine may be used for other purposes; ask your health care provider or pharmacist if you have questions. What should I tell my health care provider before I take this medicine? They need to know if you have any of these conditions: -bipolar disorder, depression, schizophrenia or other mental illness -heart disease -if you often drink alcohol -kidney disease -peripheral vascular disease -seizures -stroke -suicidal thoughts, plans, or attempt; a previous suicide attempt by you or a family member -an unusual or allergic reaction to varenicline, other medicines, foods, dyes, or preservatives -pregnant or trying to get pregnant -breast-feeding How should I use this medicine? Take this medicine by mouth after eating. Take with a full glass of water. Follow the directions on the prescription label. Take your doses at regular intervals. Do not take your medicine more often than directed. There are 3 ways you can use this medicine to help you quit smoking; talk to your  health care professional to decide which plan is right for you: 1) you can choose a quit date and start this medicine 1 week before the quit date, or, 2) you can start taking this medicine before you choose a quit date, and then pick a quit date between day 8 and 35 days of treatment, or, 3) if you are not sure that you are able or willing to quit smoking right away, start taking this medicine and slowly decrease the amount you smoke as directed by your health care professional with the goal of being cigarette-free by week 12 of treatment. Stick to your plan; ask about support groups or other ways to help you remain cigarette-free. If you are motivated to quit smoking and did not succeed during a previous attempt with this medicine for reasons other than side effects, or if you returned to smoking after this treatment, speak with your health care professional about whether another course of this medicine may be right for you. A special MedGuide will be given to you by the pharmacist with each prescription and refill. Be sure to read this information carefully each time. Talk to your pediatrician regarding the use of this medicine in children. This medicine is not approved for use in children. Overdosage: If you think you have taken too much of this medicine contact a poison control center or emergency room at once. NOTE: This medicine is only for you. Do not share this medicine with others. What if I miss a dose? If you miss a dose, take it as soon as you can. If it is almost time for your next dose, take only that dose. Do not take double or extra  doses. What may interact with this medicine? -alcohol or any product that contains alcohol -insulin -other stop smoking aids -theophylline -warfarin This list may not describe all possible interactions. Give your health care provider a list of all the medicines, herbs, non-prescription drugs, or dietary supplements you use. Also tell them if you smoke,  drink alcohol, or use illegal drugs. Some items may interact with your medicine. What should I watch for while using this medicine? Visit your doctor or health care professional for regular check ups. Ask for ongoing advice and encouragement from your doctor or healthcare professional, friends, and family to help you quit. If you smoke while on this medication, quit again Your mouth may get dry. Chewing sugarless gum or sucking hard candy, and drinking plenty of water may help. Contact your doctor if the problem does not go away or is severe. You may get drowsy or dizzy. Do not drive, use machinery, or do anything that needs mental alertness until you know how this medicine affects you. Do not stand or sit up quickly, especially if you are an older patient. This reduces the risk of dizzy or fainting spells. Sleepwalking can happen during treatment with this medicine, and can sometimes lead to behavior that is harmful to you, other people, or property. Stop taking this medicine and tell your doctor if you start sleepwalking or have other unusual sleep-related activity. Decrease the amount of alcoholic beverages that you drink during treatment with this medicine until you know if this medicine affects your ability to tolerate alcohol. Some people have experienced increased drunkenness (intoxication), unusual or sometimes aggressive behavior, or no memory of things that have happened (amnesia) during treatment with this medicine. The use of this medicine may increase the chance of suicidal thoughts or actions. Pay special attention to how you are responding while on this medicine. Any worsening of mood, or thoughts of suicide or dying should be reported to your health care professional right away. What side effects may I notice from receiving this medicine? Side effects that you should report to your doctor or health care professional as soon as possible: -allergic reactions like skin rash, itching or hives,  swelling of the face, lips, tongue, or throat -acting aggressive, being angry or violent, or acting on dangerous impulses -breathing problems -changes in vision -chest pain or chest tightness -confusion, trouble speaking or understanding -new or worsening depression, anxiety, or panic attacks -extreme increase in activity and talking (mania) -fast, irregular heartbeat -feeling faint or lightheaded, falls -fever -pain in legs when walking -problems with balance, talking, walking -redness, blistering, peeling or loosening of the skin, including inside the mouth -ringing in ears -seeing or hearing things that aren't there (hallucinations) -seizures -sleepwalking -sudden numbness or weakness of the face, arm or leg -thoughts about suicide or dying, or attempts to commit suicide -trouble passing urine or change in the amount of urine -unusual bleeding or bruising -unusually weak or tired Side effects that usually do not require medical attention (report to your doctor or health care professional if they continue or are bothersome): -constipation -headache -nausea, vomiting -strange dreams -stomach gas -trouble sleeping This list may not describe all possible side effects. Call your doctor for medical advice about side effects. You may report side effects to FDA at 1-800-FDA-1088. Where should I keep my medicine? Keep out of the reach of children. Store at room temperature between 15 and 30 degrees C (59 and 86 degrees F). Throw away any unused medicine after the expiration date.  NOTE: This sheet is a summary. It may not cover all possible information. If you have questions about this medicine, talk to your doctor, pharmacist, or health care provider.    2016, Elsevier/Gold Standard. (2015-01-22 16:14:23)     Health Maintenance, Female Adopting a healthy lifestyle and getting preventive care can go a long way to promote health and wellness. Talk with your health care provider  about what schedule of regular examinations is right for you. This is a good chance for you to check in with your provider about disease prevention and staying healthy. In between checkups, there are plenty of things you can do on your own. Experts have done a lot of research about which lifestyle changes and preventive measures are most likely to keep you healthy. Ask your health care provider for more information. WEIGHT AND DIET  Eat a healthy diet  Be sure to include plenty of vegetables, fruits, low-fat dairy products, and lean protein.  Do not eat a lot of foods high in solid fats, added sugars, or salt.  Get regular exercise. This is one of the most important things you can do for your health.  Most adults should exercise for at least 150 minutes each week. The exercise should increase your heart rate and make you sweat (moderate-intensity exercise).  Most adults should also do strengthening exercises at least twice a week. This is in addition to the moderate-intensity exercise.  Maintain a healthy weight  Body mass index (BMI) is a measurement that can be used to identify possible weight problems. It estimates body fat based on height and weight. Your health care provider can help determine your BMI and help you achieve or maintain a healthy weight.  For females 77 years of age and older:   A BMI below 18.5 is considered underweight.  A BMI of 18.5 to 24.9 is normal.  A BMI of 25 to 29.9 is considered overweight.  A BMI of 30 and above is considered obese.  Watch levels of cholesterol and blood lipids  You should start having your blood tested for lipids and cholesterol at 40 years of age, then have this test every 5 years.  You may need to have your cholesterol levels checked more often if:  Your lipid or cholesterol levels are high.  You are older than 40 years of age.  You are at high risk for heart disease.  CANCER SCREENING   Lung Cancer  Lung cancer  screening is recommended for adults 29-59 years old who are at high risk for lung cancer because of a history of smoking.  A yearly low-dose CT scan of the lungs is recommended for people who:  Currently smoke.  Have quit within the past 15 years.  Have at least a 30-pack-year history of smoking. A pack year is smoking an average of one pack of cigarettes a day for 1 year.  Yearly screening should continue until it has been 15 years since you quit.  Yearly screening should stop if you develop a health problem that would prevent you from having lung cancer treatment.  Breast Cancer  Practice breast self-awareness. This means understanding how your breasts normally appear and feel.  It also means doing regular breast self-exams. Let your health care provider know about any changes, no matter how small.  If you are in your 20s or 30s, you should have a clinical breast exam (CBE) by a health care provider every 1-3 years as part of a regular health exam.  If you are 40 or older, have a CBE every year. Also consider having a breast X-ray (mammogram) every year.  If you have a family history of breast cancer, talk to your health care provider about genetic screening.  If you are at high risk for breast cancer, talk to your health care provider about having an MRI and a mammogram every year.  Breast cancer gene (BRCA) assessment is recommended for women who have family members with BRCA-related cancers. BRCA-related cancers include:  Breast.  Ovarian.  Tubal.  Peritoneal cancers.  Results of the assessment will determine the need for genetic counseling and BRCA1 and BRCA2 testing. Cervical Cancer Your health care provider may recommend that you be screened regularly for cancer of the pelvic organs (ovaries, uterus, and vagina). This screening involves a pelvic examination, including checking for microscopic changes to the surface of your cervix (Pap test). You may be encouraged to  have this screening done every 3 years, beginning at age 22.  For women ages 19-65, health care providers may recommend pelvic exams and Pap testing every 3 years, or they may recommend the Pap and pelvic exam, combined with testing for human papilloma virus (HPV), every 5 years. Some types of HPV increase your risk of cervical cancer. Testing for HPV may also be done on women of any age with unclear Pap test results.  Other health care providers may not recommend any screening for nonpregnant women who are considered low risk for pelvic cancer and who do not have symptoms. Ask your health care provider if a screening pelvic exam is right for you.  If you have had past treatment for cervical cancer or a condition that could lead to cancer, you need Pap tests and screening for cancer for at least 20 years after your treatment. If Pap tests have been discontinued, your risk factors (such as having a new sexual partner) need to be reassessed to determine if screening should resume. Some women have medical problems that increase the chance of getting cervical cancer. In these cases, your health care provider may recommend more frequent screening and Pap tests. Colorectal Cancer  This type of cancer can be detected and often prevented.  Routine colorectal cancer screening usually begins at 40 years of age and continues through 40 years of age.  Your health care provider may recommend screening at an earlier age if you have risk factors for colon cancer.  Your health care provider may also recommend using home test kits to check for hidden blood in the stool.  A small camera at the end of a tube can be used to examine your colon directly (sigmoidoscopy or colonoscopy). This is done to check for the earliest forms of colorectal cancer.  Routine screening usually begins at age 40.  Direct examination of the colon should be repeated every 5-10 years through 40 years of age. However, you may need to be  screened more often if early forms of precancerous polyps or small growths are found. Skin Cancer  Check your skin from head to toe regularly.  Tell your health care provider about any new moles or changes in moles, especially if there is a change in a mole's shape or color.  Also tell your health care provider if you have a mole that is larger than the size of a pencil eraser.  Always use sunscreen. Apply sunscreen liberally and repeatedly throughout the day.  Protect yourself by wearing long sleeves, pants, a wide-brimmed hat, and sunglasses whenever you  are outside. HEART DISEASE, DIABETES, AND HIGH BLOOD PRESSURE   High blood pressure causes heart disease and increases the risk of stroke. High blood pressure is more likely to develop in:  People who have blood pressure in the high end of the normal range (130-139/85-89 mm Hg).  People who are overweight or obese.  People who are African American.  If you are 4-48 years of age, have your blood pressure checked every 3-5 years. If you are 34 years of age or older, have your blood pressure checked every year. You should have your blood pressure measured twice--once when you are at a hospital or clinic, and once when you are not at a hospital or clinic. Record the average of the two measurements. To check your blood pressure when you are not at a hospital or clinic, you can use:  An automated blood pressure machine at a pharmacy.  A home blood pressure monitor.  If you are between 34 years and 53 years old, ask your health care provider if you should take aspirin to prevent strokes.  Have regular diabetes screenings. This involves taking a blood sample to check your fasting blood sugar level.  If you are at a normal weight and have a low risk for diabetes, have this test once every three years after 40 years of age.  If you are overweight and have a high risk for diabetes, consider being tested at a younger age or more  often. PREVENTING INFECTION  Hepatitis B  If you have a higher risk for hepatitis B, you should be screened for this virus. You are considered at high risk for hepatitis B if:  You were born in a country where hepatitis B is common. Ask your health care provider which countries are considered high risk.  Your parents were born in a high-risk country, and you have not been immunized against hepatitis B (hepatitis B vaccine).  You have HIV or AIDS.  You use needles to inject street drugs.  You live with someone who has hepatitis B.  You have had sex with someone who has hepatitis B.  You get hemodialysis treatment.  You take certain medicines for conditions, including cancer, organ transplantation, and autoimmune conditions. Hepatitis C  Blood testing is recommended for:  Everyone born from [birth year] through 1965.  Anyone with known risk factors for hepatitis C. Sexually transmitted infections (STIs)  You should be screened for sexually transmitted infections (STIs) including gonorrhea and chlamydia if:  You are sexually active and are younger than 40 years of age.  You are older than 40 years of age and your health care provider tells you that you are at risk for this type of infection.  Your sexual activity has changed since you were last screened and you are at an increased risk for chlamydia or gonorrhea. Ask your health care provider if you are at risk.  If you do not have HIV, but are at risk, it may be recommended that you take a prescription medicine daily to prevent HIV infection. This is called pre-exposure prophylaxis (PrEP). You are considered at risk if:  You are sexually active and do not regularly use condoms or know the HIV status of your partner(s).  You take drugs by injection.  You are sexually active with a partner who has HIV. Talk with your health care provider about whether you are at high risk of being infected with HIV. If you choose to begin PrEP, you  should first be tested for  HIV. You should then be tested every 3 months for as long as you are taking PrEP.  PREGNANCY   If you are premenopausal and you may become pregnant, ask your health care provider about preconception counseling.  If you may become pregnant, take 400 to 800 micrograms (mcg) of folic acid every day.  If you want to prevent pregnancy, talk to your health care provider about birth control (contraception). OSTEOPOROSIS AND MENOPAUSE   Osteoporosis is a disease in which the bones lose minerals and strength with aging. This can result in serious bone fractures. Your risk for osteoporosis can be identified using a bone density scan.  If you are 22 years of age or older, or if you are at risk for osteoporosis and fractures, ask your health care provider if you should be screened.  Ask your health care provider whether you should take a calcium or vitamin D supplement to lower your risk for osteoporosis.  Menopause may have certain physical symptoms and risks.  Hormone replacement therapy may reduce some of these symptoms and risks. Talk to your health care provider about whether hormone replacement therapy is right for you.  HOME CARE INSTRUCTIONS   Schedule regular health, dental, and eye exams.  Stay current with your immunizations.   Do not use any tobacco products including cigarettes, chewing tobacco, or electronic cigarettes.  If you are pregnant, do not drink alcohol.  If you are breastfeeding, limit how much and how often you drink alcohol.  Limit alcohol intake to no more than 1 drink per day for nonpregnant women. One drink equals 12 ounces of beer, 5 ounces of wine, or 1 ounces of hard liquor.  Do not use street drugs.  Do not share needles.  Ask your health care provider for help if you need support or information about quitting drugs.  Tell your health care provider if you often feel depressed.  Tell your health care provider if you have ever  been abused or do not feel safe at home.   This information is not intended to replace advice given to you by your health care provider. Make sure you discuss any questions you have with your health care provider.   Document Released: 11/22/2010 Document Revised: 05/30/2014 Document Reviewed: 04/10/2013 Elsevier Interactive Patient Education [employer].

## 2016-03-22 NOTE — Progress Notes (Signed)
Subjective:    Patient ID: Cheyenne [name], female    DOB: 04/12/1976, 40 y.o.   MRN: [id number]  HPI She is here to establish with a new pcp.  She is here for a physical exam.   Her Bp has been elevated in the past, but she has never been on medication.  She does not check it regularly.  Headaches:  She has daily headaches since she was a teenager.  For a while she has been taking excedrin migraine daily - three tabs daily.  She was told she had migraines.  She denies aura, but can feel nauseous at times.  She denies imaging in the past.   Smoking:  She is smoking daily.  She does want to quit.  She is interested in trying chantix.  She has never tried it in the past.  She did quit in the past, but restarted after stress increased.   Medications and allergies reviewed with patient and updated if appropriate.  Patient Active Problem List   Diagnosis Date Noted  . Headache 03/22/2016    No current outpatient prescriptions on file prior to visit.   No current facility-administered medications on file prior to visit.     Past Medical History:  Diagnosis Date  . Detached retina, left     Past Surgical History:  Procedure Laterality Date  . EYE SURGERY Left    detached visiion    Social History   Social History  . Marital status: Single    Spouse name: N/A  . Number of children: N/A  . Years of education: N/A   Social History Main Topics  . Smoking status: Current Some Day Smoker  . Smokeless tobacco: Never Used  . Alcohol use Yes     Comment: occasionally   . Drug use: No  . Sexual activity: Not Asked   Other Topics Concern  . None   Social History Narrative   Works at [employer]   No regular exercise - walks a lot at work    Family History  Problem Relation Age of Onset  . Adopted: Yes  . Hypertension Mother     Review of Systems  Constitutional: Positive for fever (intermittent). Negative for appetite change, chills, fatigue and unexpected  weight change.  HENT: Negative for hearing loss.   Eyes: Positive for visual disturbance (blurry at times).  Respiratory: Positive for shortness of breath (sometimes when walks). Negative for cough and wheezing.        Snores  Cardiovascular: Positive for palpitations (sometimes with activity  ). Negative for chest pain and leg swelling.  Gastrointestinal: Positive for anal bleeding (blood on tissue). Negative for abdominal pain, blood in stool, constipation, diarrhea and nausea.       No gerd  Genitourinary: Negative for dysuria and hematuria.  Musculoskeletal: Positive for back pain (lower, sometimes). Negative for arthralgias and joint swelling.  Skin: Negative for color change and rash.  Neurological: Positive for dizziness (sometimes), light-headedness (sometimes) and headaches (daily - mostly wakes up with them, goes away with excedrin).  Psychiatric/Behavioral: Negative for dysphoric mood. The patient is not nervous/anxious.        Objective:   Vitals:   03/22/16 1502  BP: 120/90  Pulse: 99  Resp: 16  Temp: 98.2 F (36.8 C)   Filed Weights   03/22/16 1502  Weight: 227 lb (103 kg)   Body mass index is 36.64 kg/m.   Physical Exam Constitutional: She appears well-developed and well-nourished. No  distress.  HENT:  Head: Normocephalic and atraumatic.  Right Ear: External ear normal. Normal ear canal and TM Left Ear: External ear normal.  Normal ear canal and TM Mouth/Throat: Oropharynx is clear and moist.  Eyes: Conjunctivae and EOM are normal.  Neck: Neck supple. No tracheal deviation present. No thyromegaly present.  No carotid bruit  Cardiovascular: Normal rate, regular rhythm and normal heart sounds.   No murmur heard.  No edema. Pulmonary/Chest: Effort normal and breath sounds normal. No respiratory distress. She has no wheezes. She has no rales.  Breast: deferred to Gyn Abdominal: Soft. She exhibits no distension. There is no tenderness.  Lymphadenopathy: She  has no cervical adenopathy.  Skin: Skin is warm and dry. She is not diaphoretic.  Psychiatric: She has a normal mood and affect. Her behavior is normal.         Assessment & Plan:   Physical exam: Screening blood work ordered Immunizations  tdap today Mammogram - will be done by gyn Gyn - Up to date  Exercise  - stressed regular exercise Weight - stressed weight loss Skin  - none Substance abuse -- smoking - discussed at length - will start chantix  See Problem List for Assessment and Plan of chronic medical problems.

## 2016-03-23 ENCOUNTER — Other Ambulatory Visit (INDEPENDENT_AMBULATORY_CARE_PROVIDER_SITE_OTHER): Payer: BC Managed Care – PPO

## 2016-03-23 DIAGNOSIS — Z0000 Encounter for general adult medical examination without abnormal findings: Secondary | ICD-10-CM

## 2016-03-23 LAB — COMPREHENSIVE METABOLIC PANEL
ALK PHOS: 49 U/L (ref 39–117)
ALT: 7 U/L (ref 0–35)
AST: 11 U/L (ref 0–37)
Albumin: 4.1 g/dL (ref 3.5–5.2)
BUN: 13 mg/dL (ref 6–23)
CO2: 26 meq/L (ref 19–32)
Calcium: 9.6 mg/dL (ref 8.4–10.5)
Chloride: 104 mEq/L (ref 96–112)
Creatinine, Ser: 0.88 mg/dL (ref 0.40–1.20)
GFR: 91.33 mL/min (ref >60.00)
GLUCOSE: 93 mg/dL (ref 70–99)
POTASSIUM: 3.7 meq/L (ref 3.5–5.1)
SODIUM: 138 meq/L (ref 135–145)
TOTAL PROTEIN: 7.8 g/dL (ref 6.0–8.3)
Total Bilirubin: 0.5 mg/dL (ref 0.2–1.2)

## 2016-03-23 LAB — CBC WITH DIFFERENTIAL/PLATELET
BASOS ABS: 0.1 10*3/uL (ref 0.0–0.1)
Basophils Relative: 0.6 % (ref 0.0–3.0)
EOS PCT: 1.8 % (ref 0.0–5.0)
Eosinophils Absolute: 0.2 10*3/uL (ref 0.0–0.7)
HCT: 33.6 % — ABNORMAL LOW (ref 36.0–46.0)
Hemoglobin: 11 g/dL — ABNORMAL LOW (ref 12.0–15.0)
LYMPHS ABS: 2.5 10*3/uL (ref 0.7–4.0)
Lymphocytes Relative: 27.8 % (ref 12.0–46.0)
MCHC: 32.6 g/dL (ref 30.0–36.0)
MCV: 70.4 fl — ABNORMAL LOW (ref 78.0–100.0)
MONO ABS: 0.7 10*3/uL (ref 0.1–1.0)
Monocytes Relative: 8.2 % (ref 3.0–12.0)
NEUTROS PCT: 61.6 % (ref 43.0–77.0)
Neutro Abs: 5.6 10*3/uL (ref 1.4–7.7)
Platelets: 423 10*3/uL — ABNORMAL HIGH (ref 150.0–400.0)
RBC: 4.78 Mil/uL (ref 3.87–5.11)
RDW: 17 % — ABNORMAL HIGH (ref 11.5–15.5)
WBC: 9.1 10*3/uL (ref 4.0–10.5)

## 2016-03-23 LAB — LIPID PANEL
CHOL/HDL RATIO: 6
Cholesterol: 169 mg/dL (ref 0–200)
HDL: 29.1 mg/dL — AB (ref >39.00)
LDL CALC: 119 mg/dL — AB (ref 0–99)
NONHDL: 139.72
Triglycerides: 106 mg/dL (ref 0.0–149.0)
VLDL: 21.2 mg/dL (ref 0.0–40.0)

## 2016-03-23 LAB — TSH: TSH: 0.59 u[IU]/mL (ref 0.35–4.50)

## 2016-03-23 LAB — HEMOGLOBIN A1C: Hgb A1c MFr Bld: 5.4 % (ref 4.6–6.5)

## 2016-06-07 ENCOUNTER — Encounter: Payer: Self-pay | Admitting: Internal Medicine

## 2016-09-18 NOTE — Progress Notes (Signed)
Subjective:    Patient ID: Cheyenne [name], female    DOB: Sep 04, 1975, 41 y.o.   MRN: [id number]  HPI The patient is here for follow up.  Cough:  She has had a cough for a few days.  It is constant at night..  She denies fever, congestion, ear pain, sore throat, pnd, wheeze, sob and headaches.  She usually does not have allergies.  She took claritin once, but did not think it was allergies.  She has try dayquil and nyquil and they have not helped.   Obesity:  She is thinking about weight loss surgery.  She has never been on weight loss medication. She walks a lot at work, but does not exercise outside of work.    Smoking:  She did not tolerate the chantix - it caused crazy dreams and thoughts.  She is still smoking about 1ppd.  She tried vapping and nicotine patches and they were not effective.  She wants to quit and wonders what else she can take or do.   Headaches:  She has frequent headaches and takes excedrin as needed.  She feels this controls her headaches when she takes it.   Medications and allergies reviewed with patient and updated if appropriate.  Patient Active Problem List   Diagnosis Date Noted  . Headache 03/22/2016  . Nicotine dependence 03/22/2016    Current Outpatient Prescriptions on File Prior to Visit  Medication Sig Dispense Refill  . aspirin-acetaminophen-caffeine (EXCEDRIN MIGRAINE) 250-250-65 MG tablet Take 1 tablet by mouth every 6 (six) hours as needed for headache. 30 tablet 0  . varenicline (CHANTIX STARTING MONTH PAK) 0.5 MG X 11 & 1 MG X 42 tablet Take one 0.5 mg tab PO daily for 3 days, then increase to one 0.5 mg tab BID for 4 days, then increase to one 1 mg tab BID. 53 tablet 0   No current facility-administered medications on file prior to visit.     Past Medical History:  Diagnosis Date  . Detached retina, left     Past Surgical History:  Procedure Laterality Date  . EYE SURGERY Left    detached visiion    Social History   Social  History  . Marital status: Single    Spouse name: N/A  . Number of children: N/A  . Years of education: N/A   Social History Main Topics  . Smoking status: Current Some Day Smoker  . Smokeless tobacco: Never Used  . Alcohol use Yes     Comment: occasionally   . Drug use: No  . Sexual activity: Not on file   Other Topics Concern  . Not on file   Social History Narrative   Works at [employer]   No regular exercise - walks a lot at work    Family History  Problem Relation Age of Onset  . Adopted: Yes  . Hypertension Mother     Review of Systems  Constitutional: Negative for chills and fever.  HENT: Positive for sneezing. Negative for congestion, ear pain, postnasal drip, sinus pain, sinus pressure and sore throat.   Respiratory: Positive for cough. Negative for shortness of breath and wheezing.   Cardiovascular: Negative for chest pain.  Neurological: Negative for light-headedness and headaches.       Objective:   Vitals:   09/19/16 1111  BP: 128/88  Pulse: 76  Resp: 16  Temp: 98.2 F (36.8 C)   Wt Readings from Last 3 Encounters:  09/19/16 225 lb (102.1  kg)  03/22/16 227 lb (103 kg)   Body mass index is 36.32 kg/m.   Physical Exam    GENERAL APPEARANCE: Appears stated age, well appearing, NAD EYES: conjunctiva clear, no icterus HEENT: bilateral tympanic membranes and ear canals normal, oropharynx with mild erythema, no thyromegaly, trachea midline, no cervical or supraclavicular lymphadenopathy LUNGS: Clear to auscultation without wheeze or crackles, unlabored breathing, good air entry bilaterally HEART: Normal S1,S2 without murmurs EXTREMITIES: Without clubbing, cyanosis, or edema      Assessment & Plan:    See Problem List for Assessment and Plan of chronic medical problems.

## 2016-09-19 ENCOUNTER — Encounter: Payer: Self-pay | Admitting: Internal Medicine

## 2016-09-19 ENCOUNTER — Ambulatory Visit (INDEPENDENT_AMBULATORY_CARE_PROVIDER_SITE_OTHER): Payer: BC Managed Care – PPO | Admitting: Internal Medicine

## 2016-09-19 VITALS — BP 128/88 | HR 76 | Temp 98.2°F | Resp 16 | Wt 225.0 lb

## 2016-09-19 DIAGNOSIS — R05 Cough: Secondary | ICD-10-CM

## 2016-09-19 DIAGNOSIS — Z6836 Body mass index (BMI) 36.0-36.9, adult: Secondary | ICD-10-CM

## 2016-09-19 DIAGNOSIS — E6609 Other obesity due to excess calories: Secondary | ICD-10-CM

## 2016-09-19 DIAGNOSIS — E669 Obesity, unspecified: Secondary | ICD-10-CM | POA: Insufficient documentation

## 2016-09-19 DIAGNOSIS — F17218 Nicotine dependence, cigarettes, with other nicotine-induced disorders: Secondary | ICD-10-CM | POA: Diagnosis not present

## 2016-09-19 DIAGNOSIS — R059 Cough, unspecified: Secondary | ICD-10-CM | POA: Insufficient documentation

## 2016-09-19 DIAGNOSIS — R519 Headache, unspecified: Secondary | ICD-10-CM

## 2016-09-19 DIAGNOSIS — R51 Headache: Secondary | ICD-10-CM | POA: Diagnosis not present

## 2016-09-19 MED ORDER — BENZONATATE 200 MG PO CAPS: 200.0000 mg | ORAL_CAPSULE | Freq: Three times a day (TID) | ORAL | 0 refills | Status: DC | PRN

## 2016-09-19 MED ORDER — BUPROPION HCL ER (XL) 150 MG PO TB24: 150.0000 mg | ORAL_TABLET | ORAL | 5 refills | Status: DC

## 2016-09-19 NOTE — Assessment & Plan Note (Signed)
The patient was counseled on the dangers of tobacco use, and was advised to quit.  Reviewed strategies to maximize success, including stress management, pharmacotherapy (wellbutrin or retrying chantix), e-cigs, vapping, and nicotine replacment.  Agrees to try wellbutrin.  Advised changing when she smokes as well to start to break some habits

## 2016-09-19 NOTE — Assessment & Plan Note (Addendum)
Taking excedrin as needed Continue excedrin

## 2016-09-19 NOTE — Assessment & Plan Note (Signed)
Reviewed surgery, lifestyle changes and weight loss medications Will attempt medication - may not be a candidate for surgery Stressed increasing exercise and working on eating enough but not too much Consider seeing a nutritionist Will call insurance about weight loss medications which we discussed - info given

## 2016-09-19 NOTE — Patient Instructions (Addendum)
Weight loss medications:  Qsymia, Saxenda (injection), contrave - see if they are covered by your insurance  Start the wellbutrin for the smoking.  Take the benzoate for the cough as needed.   Phentermine; Topiramate extended-release capsules (Qsymia) What is this medicine? Phentermine; topiramate (FEN ter meen; toe PYRE a mate) is a combination of two medicines used with a reduced calorie diet and exercise to help you lose weight. This medicine is only available through certified pharmacies enrolled in a special program. Your healthcare professional will tell you where you can get your medicine. If you have additional questions, you can visit the manufacture's website at [email or web address] or contact them by phone at [phone]. This medicine may be used for other purposes; ask your health care provider or pharmacist if you have questions. COMMON BRAND NAME(S): Qsymia What should I tell my health care provider before I take this medicine? They need to know if you have any of these conditions: -agitation -diarrhea -depression or other mental illness -diabetes -glaucoma -heart disease -high or low blood pressure -history of anorexia or other eating disorder -history of substance abuse -kidney stones or kidney disease -liver disease -lung disease like asthma, obstructive pulmonary disease, emphysema -metabolic acidosis -on a ketogenic diet -scheduled for surgery or a procedure -suicidal thoughts, plans, or attempt; a previous suicide attempt by you or a family member -taken an MAOI like Carbex, Eldepryl, Marplan, Nardil, or Parnate in last 14 days -thyroid disease -an unusual or allergic reaction to phentermine, topiramate, other medicines, foods, dyes, or preservatives -pregnant or trying to get pregnant -breast-feeding How should I use this medicine? Take this medicine by mouth with a glass of water. Follow the directions on the prescription label. Do not crush or chew. This  medicine is usually taken with or without food once per day in the morning. Avoid taking this medicine in the evening. It may interfere with sleep. Take your doses at regular intervals. Do not take your medicine more often than directed. A special MedGuide will be given to you by the pharmacist with each prescription and refill. Be sure to read this information carefully each time. Talk to your pediatrician regarding the use of this medicine in children. Special care may be needed. Overdosage: If you think you have taken too much of this medicine contact a poison control center or emergency room at once. NOTE: This medicine is only for you. Do not share this medicine with others. What if I miss a dose? If you miss a dose, take it as soon as you can. If it is almost time for your next dose, take only that dose. Do not take double or extra doses. What may interact with this medicine? Do not take this medicine with any of the following medications: -MAOIs like Carbex, Eldepryl, Marplan, Nardil, and Parnate This medicine may also interact with the following medications: -acetazolamide -amitriptyline -antihistamines for allergy, cough and cold -atropine -birth control pills -carbamazepine -certain medicines for bladder problems like oxybutynin, tolterodine -certain medicines for depression, anxiety, or psychotic disturbances -certain medicines for Parkinson's disease like benztropine, trihexyphenidyl -certain medicines for stomach problems like dicyclomine, hyoscyamine -certain medicines for travel sickness like scopolamine -dichlorphenamide -digoxin -diltiazem -diuretics -hydrochlorothiazide -ipratropium -lithium -medicines for diabetes -medicines for pain, sleep, or muscle relaxation -methazolamide -phenytoin -pioglitazone -stimulant medicines for attention disorders, weight loss, or to stay awake -valproic acid -zonisamide This list may not describe all possible interactions. Give  your health care provider a list of all the medicines, herbs, non-prescription drugs,  or dietary supplements you use. Also tell them if you smoke, drink alcohol, or use illegal drugs. Some items may interact with your medicine. What should I watch for while using this medicine? Visit your doctor or health care professional for regular checks on your progress. This medicine is intended to be used in addition to a healthy diet and appropriate exercise. The best results are achieved this way. Do not increase or in any way change your dose without consulting your doctor or health care professional. Do not take this medicine within 6 hours of bedtime. It can keep you from getting to sleep. Avoid drinks that contain caffeine and try to stick to a regular bedtime every night. Do not stop taking this medicine suddenly. This increases the risk of seizures. This medicine can decrease sweating and increase your body temperature. Watch for signs of deceased sweating or fever. Avoid extreme heat, hot baths, and saunas. Be careful about exercising, especially in hot weather. Contact your health care provider right away if you notice a fever or decrease in sweating. You should drink plenty of fluids while taking this medicine. If you have had kidney stones in the past, this will help to reduce your chances of forming kidney stones. If you have stomach pain, with nausea or vomiting and yellowing of your eyes or skin, call your doctor immediately. You may get drowsy or dizzy. Do not drive, use machinery, or do anything that needs mental alertness until you know how this medicine affects you. Do not stand or sit up quickly, especially if you are an older patient. This reduces the risk of dizzy or fainting spells. Alcohol may increase dizziness and drowsiness. Avoid alcoholic drinks. This medicine may affect blood sugar levels. If you have diabetes, check with your doctor or health care professional before you change your  diet or the dose of your diabetic medicine. Patients and their families should watch out for worsening depression or thoughts of suicide. Also watch out for sudden changes in feelings such as feeling anxious, agitated, panicky, irritable, hostile, aggressive, impulsive, severely restless, overly excited and hyperactive, or not being able to sleep. If this happens, especially at the beginning of treatment or after a change in dose, call your health care professional. If you notice blurred vision, eye pain, or other eye problems, seek medical attention at once for an eye exam. This medicine may increase the chance of developing metabolic acidosis. If left untreated, this can cause kidney stones, bone disease, or slowed growth in children. Symptoms include breathing fast, fatigue, loss of appetite, irregular heartbeat, or loss of consciousness. Call your doctor immediately if you experience any of these side effects. Also, tell your doctor about any surgery you plan on having while taking this medicine since this may increase your risk for metabolic acidosis. Women who become pregnant while using this medicine should contact their physician immediately. You should also contact The Qsymia Pregnancy Surveillance Program which is a program that monitors pregnancies that occur during treatment. Contact the program by calling [phone]. What side effects may I notice from receiving this medicine? Side effects that you should report to your doctor or health care professional as soon as possible: -allergic reactions like skin rash, itching or hives, swelling of the face, lips, or tongue -blood in the urine -changes in vision -chest pain or chest tightness -confusion -depressed mood -difficulty breathing -dizziness -fast or irregular heartbeat -feeling anxious -irritable -loss of appetite -low blood pressure -pain in the lower back or side -  pain, tingling, numbness in the hands or feet -pain when  urinating -palpitations -redness, blistering, peeling or loosening of the skin, including inside the mouth -shortness of breath -suicidal thoughts or other mood changes -trouble passing urine or change in the amount of urine -trouble walking, dizziness, loss of balance or coordination -unusually weak or tired -vomiting Side effects that usually do not require medical attention (report to your doctor or health care professional if they continue or are bothersome): -change in sex drive or performance -changes in vision -constipation -diarrhea -dry mouth -headache -nausea -tremors -trouble sleeping -upset stomach This list may not describe all possible side effects. Call your doctor for medical advice about side effects. You may report side effects to FDA at 1-800-FDA-1088. Where should I keep my medicine? Keep out of the reach of children. This medicine can be abused. Keep your medicine in a safe place to protect it from theft. Do not share this medicine with anyone. Selling or giving away this medicine is dangerous and against the law. This medicine may cause accidental overdose and death if taken by other adults, children, or pets. Mix any unused medicine with a substance like cat litter or coffee grounds. Then throw the medicine away in a sealed container like a sealed bag or a coffee can with a lid. Do not use the medicine after the expiration date. Store at room temperature between 15 and 25 degrees C (59 and 77 degrees F). NOTE: This sheet is a summary. It may not cover all possible information. If you have questions about this medicine, talk to your doctor, pharmacist, or health care provider.  2018 Elsevier/Gold Standard (2015-06-11 14:30:46)   Bupropion; Naltrexone extended-release tablets (Contrave) What is this medicine? BUPROPION; NALTREXONE (byoo PROE pee on; nal TREX one) is a combination product used to promote and maintain weight loss in obese adults or overweight adults  who also have weight related medical problems. This medicine should be used with a reduced calorie diet and increased physical activity. This medicine may be used for other purposes; ask your health care provider or pharmacist if you have questions. COMMON BRAND NAME(S): CONTRAVE What should I tell my health care provider before I take this medicine? They need to know if you have any of these conditions: -an eating disorder, such as anorexia or bulimia -bipolar disorder -diabetes -depression -drug abuse or addiction -glaucoma -head injury -heart disease -high blood pressure -history of a tumor or infection of your brain or spine -history of stroke -history of irregular heartbeat -if you often drink alcohol -kidney disease -liver disease -schizophrenia -seizures -suicidal thoughts, plans, or attempt; a previous suicide attempt by you or a family member -an unusual or allergic reaction to bupropion, naltrexone, other medicines, foods, dyes, or preservatives -breast-feeding -pregnant or trying to become pregnant How should I use this medicine? Take this medicine by mouth with a glass of water. Follow the directions on the prescription label. Take this medicine in the morning and in the evenings as directed by your healthcare professional. Bonita Quin can take it with or without food. Do not take with high-fat meals as this may increase your risk of seizures. Do not crush, chew, or cut these tablets. Do not take your medicine more often than directed. Do not stop taking this medicine suddenly except upon the advice of your doctor. A special MedGuide will be given to you by the pharmacist with each prescription and refill. Be sure to read this information carefully each time. Talk to your pediatrician  regarding the use of this medicine in children. Special care may be needed. Overdosage: If you think you have taken too much of this medicine contact a poison control center or emergency room at  once. NOTE: This medicine is only for you. Do not share this medicine with others. What if I miss a dose? If you miss a dose, skip the missed dose and take your next tablet at the regular time. Do not take double or extra doses. What may interact with this medicine? Do not take this medicine with any of the following medications: -any prescription or street opioid drug like codeine, heroin, methadone -linezolid -MAOIs like Carbex, Eldepryl, Marplan, Nardil, and Parnate -methylene blue (injected into a vein) -other medicines that contain bupropion like Zyban or Wellbutrin This medicine may also interact with the following medications: -alcohol -certain medicines for anxiety or sleep -certain medicines for blood pressure like metoprolol, propranolol -certain medicines for depression or psychotic disturbances -certain medicines for HIV or AIDS like efavirenz, lopinavir, nelfinavir, ritonavir -certain medicines for irregular heart beat like propafenone, flecainide -certain medicines for Parkinson's disease like amantadine, levodopa -certain medicines for seizures like carbamazepine, phenytoin, phenobarbital -cimetidine -clopidogrel -cyclophosphamide -digoxin -disulfiram -furazolidone -isoniazid -nicotine -orphenadrine -procarbazine -steroid medicines like prednisone or cortisone -stimulant medicines for attention disorders, weight loss, or to stay awake -tamoxifen -theophylline -thioridazine -thiotepa -ticlopidine -tramadol -warfarin This list may not describe all possible interactions. Give your health care provider a list of all the medicines, herbs, non-prescription drugs, or dietary supplements you use. Also tell them if you smoke, drink alcohol, or use illegal drugs. Some items may interact with your medicine. What should I watch for while using this medicine? This medicine is intended to be used in addition to a healthy diet and appropriate exercise. The best results are  achieved this way. Do not increase or in any way change your dose without consulting your doctor or health care professional. Do not take this medicine with other prescription or over-the-counter weight loss products without consulting your doctor or health care professional. Your doctor should tell you to stop taking this medicine if you do not lose a certain amount of weight within the first 12 weeks of treatment. Visit your doctor or health care professional for regular checkups. Your doctor may order blood tests or other tests to see how you are doing. This medicine may affect blood sugar levels. If you have diabetes, check with your doctor or health care professional before you change your diet or the dose of your diabetic medicine. Patients and their families should watch out for new or worsening depression or thoughts of suicide. Also watch out for sudden changes in feelings such as feeling anxious, agitated, panicky, irritable, hostile, aggressive, impulsive, severely restless, overly excited and hyperactive, or not being able to sleep. If this happens, especially at the beginning of treatment or after a change in dose, call your health care professional. Avoid alcoholic drinks while taking this medicine. Drinking large amounts of alcoholic beverages, using sleeping or anxiety medicines, or quickly stopping the use of these agents while taking this medicine may increase your risk for a seizure. What side effects may I notice from receiving this medicine? Side effects that you should report to your doctor or health care professional as soon as possible: -allergic reactions like skin rash, itching or hives, swelling of the face, lips, or tongue -breathing problems -changes in vision -confusion -elevated mood, decreased need for sleep, racing thoughts, impulsive behavior -fast or irregular heartbeat -hallucinations,  loss of contact with reality -increased blood pressure -redness, blistering,  peeling or loosening of the skin, including inside the mouth -seizures -signs and symptoms of liver injury like dark yellow or brown urine; general ill feeling or flu-like symptoms; light-colored stools; loss of appetite; nausea; right upper belly pain; unusually weak or tired; yellowing of the eyes or skin -suicidal thoughts or other mood changes -vomiting Side effects that usually do not require medical attention (report to your doctor or health care professional if they continue or are bothersome): -constipation -headache -loss of appetite -indigestion, stomach upset -tremors This list may not describe all possible side effects. Call your doctor for medical advice about side effects. You may report side effects to FDA at 1-800-FDA-1088. Where should I keep my medicine? Keep out of the reach of children. Store at room temperature between 15 and 30 degrees C (59 and 86 degrees F). Throw away any unused medicine after the expiration date. NOTE: This sheet is a summary. It may not cover all possible information. If you have questions about this medicine, talk to your doctor, pharmacist, or health care provider.  2018 Elsevier/Gold Standard (2015-10-30 13:42:58)    Liraglutide injection (Weight Management) (Saxenda) What is this medicine? LIRAGLUTIDE (LIR a GLOO tide) is used with a reduced calorie diet and exercise to help you lose weight. This medicine may be used for other purposes; ask your health care provider or pharmacist if you have questions. COMMON BRAND NAME(S): Saxenda What should I tell my health care provider before I take this medicine? They need to know if you have any of these conditions: -endocrine tumors (MEN 2) or if someone in your family had these tumors -gallbladder disease -high cholesterol -history of alcohol abuse problem -history of pancreatitis -kidney disease or if you are on dialysis -liver disease -previous swelling of the tongue, face, or lips with  difficulty breathing, difficulty swallowing, hoarseness, or tightening of the throat -stomach problems -suicidal thoughts, plans, or attempt; a previous suicide attempt by you or a family member -thyroid cancer or if someone in your family had thyroid cancer -an unusual or allergic reaction to liraglutide, other medicines, foods, dyes, or preservatives -pregnant or trying to get pregnant -breast-feeding How should I use this medicine? This medicine is for injection under the skin of your upper leg, stomach area, or upper arm. You will be taught how to prepare and give this medicine. Use exactly as directed. Take your medicine at regular intervals. Do not take it more often than directed. It is important that you put your used needles and syringes in a special sharps container. Do not put them in a trash can. If you do not have a sharps container, call your pharmacist or healthcare provider to get one. A special MedGuide will be given to you by the pharmacist with each prescription and refill. Be sure to read this information carefully each time. Talk to your pediatrician regarding the use of this medicine in children. Special care may be needed. Overdosage: If you think you have taken too much of this medicine contact a poison control center or emergency room at once. NOTE: This medicine is only for you. Do not share this medicine with others. What if I miss a dose? If you miss a dose, take it as soon as you can. If it is almost time for your next dose, take only that dose. Do not take double or extra doses. If you miss your dose for 3 days or more, call your  doctor or health care professional to talk about how to restart this medicine. What may interact with this medicine? -insulin and other medicines for diabetes This list may not describe all possible interactions. Give your health care provider a list of all the medicines, herbs, non-prescription drugs, or dietary supplements you use. Also tell  them if you smoke, drink alcohol, or use illegal drugs. Some items may interact with your medicine. What should I watch for while using this medicine? Visit your doctor or health care professional for regular checks on your progress. This medicine is intended to be used in addition to a healthy diet and appropriate exercise. The best results are achieved this way. Do not increase or in any way change your dose without consulting your doctor or health care professional. Drink plenty of fluids while taking this medicine. Check with your doctor or health care professional if you get an attack of severe diarrhea, nausea, and vomiting. The loss of too much body fluid can make it dangerous for you to take this medicine. This medicine may affect blood sugar levels. If you have diabetes, check with your doctor or health care professional before you change your diet or the dose of your diabetic medicine. Patients and their families should watch out for worsening depression or thoughts of suicide. Also watch out for sudden changes in feelings such as feeling anxious, agitated, panicky, irritable, hostile, aggressive, impulsive, severely restless, overly excited and hyperactive, or not being able to sleep. If this happens, especially at the beginning of treatment or after a change in dose, call your health care professional. What side effects may I notice from receiving this medicine? Side effects that you should report to your doctor or health care professional as soon as possible: -allergic reactions like skin rash, itching or hives, swelling of the face, lips, or tongue -breathing problems -diarrhea that continues or is severe -lump or swelling on the neck -severe nausea -signs and symptoms of infection like fever or chills; cough; sore throat; pain or trouble passing urine -signs and symptoms of low blood sugar such as feeling anxious, confusion, dizziness, increased hunger, unusually weak or tired, sweating,  shakiness, cold, irritable, headache, blurred vision, fast heartbeat, loss of consciousness -signs and symptoms of kidney injury like trouble passing urine or change in the amount of urine -trouble swallowing -unusual stomach upset or pain -vomiting Side effects that usually do not require medical attention (report to your doctor or health care professional if they continue or are bothersome): -constipation -decreased appetite -diarrhea -fatigue -headache -nausea -pain, redness, or irritation at site where injected -stomach upset -stuffy or runny nose This list may not describe all possible side effects. Call your doctor for medical advice about side effects. You may report side effects to FDA at 1-800-FDA-1088. Where should I keep my medicine? Keep out of the reach of children. Store unopened pen in a refrigerator between 2 and 8 degrees C (36 and 46 degrees F). Do not freeze or use if the medicine has been frozen. Protect from light and excessive heat. After you first use the pen, it can be stored at room temperature between 15 and 30 degrees C (59 and 86 degrees F) or in a refrigerator. Throw away your used pen after 30 days or after the expiration date, whichever comes first. Do not store your pen with the needle attached. If the needle is left on, medicine may leak from the pen. NOTE: This sheet is a summary. It may not cover all  possible information. If you have questions about this medicine, talk to your doctor, pharmacist, or health care provider.  2018 Elsevier/Gold Standard (2016-05-26 14:41:37)

## 2016-09-19 NOTE — Assessment & Plan Note (Signed)
Dry, few days Likely allergies, possibly viral Start claritin Tessalon perles as needed

## 2016-09-19 NOTE — Progress Notes (Signed)
Pre visit review using our clinic review tool, if applicable. No additional management support is needed unless otherwise documented below in the visit note. 

## 2017-05-07 ENCOUNTER — Encounter (HOSPITAL_COMMUNITY): Payer: Self-pay | Admitting: Emergency Medicine

## 2017-05-07 ENCOUNTER — Emergency Department (HOSPITAL_COMMUNITY)
Admission: EM | Admit: 2017-05-07 | Discharge: 2017-05-07 | Disposition: A | Discharge status: Home / Self Care | Payer: BC Managed Care – PPO | Attending: Emergency Medicine | Admitting: Emergency Medicine

## 2017-05-07 DIAGNOSIS — R5383 Other fatigue: Secondary | ICD-10-CM | POA: Insufficient documentation

## 2017-05-07 DIAGNOSIS — M791 Myalgia, unspecified site: Secondary | ICD-10-CM | POA: Insufficient documentation

## 2017-05-07 DIAGNOSIS — R630 Anorexia: Secondary | ICD-10-CM | POA: Insufficient documentation

## 2017-05-07 DIAGNOSIS — R05 Cough: Secondary | ICD-10-CM | POA: Diagnosis not present

## 2017-05-07 DIAGNOSIS — R69 Illness, unspecified: Secondary | ICD-10-CM | POA: Insufficient documentation

## 2017-05-07 DIAGNOSIS — R0981 Nasal congestion: Secondary | ICD-10-CM | POA: Diagnosis not present

## 2017-05-07 DIAGNOSIS — F17200 Nicotine dependence, unspecified, uncomplicated: Secondary | ICD-10-CM | POA: Diagnosis not present

## 2017-05-07 DIAGNOSIS — J111 Influenza due to unidentified influenza virus with other respiratory manifestations: Secondary | ICD-10-CM

## 2017-05-07 DIAGNOSIS — R509 Fever, unspecified: Secondary | ICD-10-CM | POA: Diagnosis present

## 2017-05-07 MED ORDER — OSELTAMIVIR PHOSPHATE 75 MG PO CAPS: 75.0000 mg | ORAL_CAPSULE | Freq: Two times a day (BID) | ORAL | 0 refills | Status: DC

## 2017-05-07 MED ORDER — BENZONATATE 200 MG PO CAPS: 200.0000 mg | ORAL_CAPSULE | Freq: Three times a day (TID) | ORAL | 0 refills | Status: DC

## 2017-05-07 NOTE — Discharge Instructions (Signed)
Your symptoms are consistent with a viral illness. Viruses do not require antibiotics. Treatment is symptomatic care and it is important to note that these symptoms may last for 7-14 days.   Hand washing: Wash your hands throughout the day, but especially before and after touching the face, using the restroom, sneezing, coughing, or touching surfaces that have been coughed or sneezed upon. Hydration: Symptoms will be intensified and complicated by dehydration. Dehydration can also extend the duration of symptoms. Drink plenty of fluids and get plenty of rest. You should be drinking at least half a liter of water an hour to stay hydrated. Electrolyte drinks (ex. Gatorade, Powerade, Pedialyte) are also encouraged. You should be drinking enough fluids to make your urine light yellow, almost clear. If this is not the case, you are not drinking enough water. Please note that some of the treatments indicated below will not be effective if you are not adequately hydrated. Pain or fever: Ibuprofen, Naproxen, or Tylenol for pain or fever.  Cough: Use the Tessalon for cough.  Zyrtec or Claritin: May add these medication daily to control underlying symptoms of congestion, sneezing, and other signs of allergies. Flonase: Use this medication, as directed, for nasal and sinus congestion. Congestion: Plain Mucinex may help relieve congestion. Saline sinus rinses and saline nasal sprays may also help relieve congestion. If you do not have heart problems or an allergy to such medications, you may also try phenylephrine or Sudafed. Sore throat: Warm liquids or Chloraseptic spray may help soothe a sore throat. Gargle twice a day with a salt water solution made from a half teaspoon of salt in a cup of warm water.  Follow up: Follow up with a primary care provider, as needed, for any future management of this issue.

## 2017-05-07 NOTE — ED Provider Notes (Signed)
[facility]-EMERGENCY DEPT Provider Note   CSN: [id number] Arrival date & time: 05/07/17  1315     History   Chief Complaint Chief Complaint  Patient presents with  . Flu Like Symptoms    HPI Cheyenne [name] is a 41 y.o. female.  HPI   Cheyenne [name] is a 41 y.o. female, patient with no pertinent past medical history, presenting to the ED with subjective fever, chills, cough, congestion, and body aches for the past two day.  Accompanied by decreased appetite and fatigue.  States symptoms are consistent with her diagnosis of the flu last year.  States she received Tamiflu and Tessalon and she did well with these medications.  She did not have influenza vaccination this season.  Denies shortness of breath, chest pain, abdominal pain, N/V/D, or any other complaints.    Past Medical History:  Diagnosis Date  . Detached retina, left     Patient Active Problem List   Diagnosis Date Noted  . Obesity 09/19/2016  . Cough 09/19/2016  . Headache 03/22/2016  . Nicotine dependence 03/22/2016    Past Surgical History:  Procedure Laterality Date  . EYE SURGERY Left    detached visiion    OB History    No data available       Home Medications    Prior to Admission medications   Medication Sig Start Date End Date Taking? Authorizing Provider  aspirin-acetaminophen-caffeine (EXCEDRIN MIGRAINE) 2234742836250-250-65 MG tablet Take 1 tablet by mouth every 6 (six) hours as needed for headache. 03/22/16   Pincus SanesBurns, Stacy J, MD  benzonatate (TESSALON) 200 MG capsule Take 1 capsule (200 mg total) by mouth 3 (three) times daily as needed for cough. 09/19/16   Pincus SanesBurns, Stacy J, MD  benzonatate (TESSALON) 200 MG capsule Take 1 capsule (200 mg total) by mouth every 8 (eight) hours. 05/07/17   Joy, Shawn C, PA-C  buPROPion (WELLBUTRIN XL) 150 MG 24 hr tablet Take 1 tablet (150 mg total) by mouth every morning. 09/19/16   Pincus SanesBurns, Stacy J, MD  oseltamivir (TAMIFLU) 75 MG capsule Take  1 capsule (75 mg total) by mouth every 12 (twelve) hours. 05/07/17   Joy, Hillard DankerShawn C, PA-C    Family History Family History  Adopted: Yes  Problem Relation Age of Onset  . Hypertension Mother     Social History Social History   Tobacco Use  . Smoking status: Current Some Day Smoker  . Smokeless tobacco: Never Used  Substance Use Topics  . Alcohol use: Yes    Comment: occasionally   . Drug use: No     Allergies   Patient has no known allergies.   Review of Systems Review of Systems  Constitutional: Positive for appetite change, chills, fatigue and fever.  HENT: Positive for congestion. Negative for trouble swallowing.   Respiratory: Positive for cough. Negative for shortness of breath.   Cardiovascular: Negative for chest pain.  Gastrointestinal: Negative for abdominal pain, diarrhea, nausea and vomiting.  Musculoskeletal: Positive for myalgias. Negative for neck stiffness.  Neurological: Negative for dizziness, light-headedness and headaches.  All other systems reviewed and are negative.    Physical Exam Updated Vital Signs BP (!) 138/97 (BP Location: Left Arm)   Pulse 84   Temp 97.7 F (36.5 C) (Oral)   Resp 19   Ht 5\' 6"  (1.676 m)   Wt 99.8 kg (220 lb)   LMP 04/25/2017 (Approximate)   SpO2 94%   BMI 35.51 kg/m   Physical Exam  Constitutional: She appears well-developed and well-nourished. No distress.  HENT:  Head: Normocephalic and atraumatic.  Eyes: Conjunctivae are normal.  Neck: Neck supple.  Cardiovascular: Normal rate, regular rhythm, normal heart sounds and intact distal pulses.  Pulmonary/Chest: Effort normal and breath sounds normal. No respiratory distress.  Abdominal: Soft. There is no tenderness. There is no guarding.  Musculoskeletal: She exhibits no edema.  Lymphadenopathy:    She has no cervical adenopathy.  Neurological: She is alert.  Skin: Skin is warm and dry. Capillary refill takes less than 2 seconds. She is not diaphoretic.    Psychiatric: She has a normal mood and affect. Her behavior is normal.  Nursing note and vitals reviewed.    ED Treatments / Results  Labs (all labs ordered are listed, but only abnormal results are displayed) Labs Reviewed - No data to display  EKG  EKG Interpretation None       Radiology No results found.  Procedures Procedures (including critical care time)  Medications Ordered in ED Medications - No data to display   Initial Impression / Assessment and Plan / ED Course  I have reviewed the triage vital signs and the nursing notes.  Pertinent labs & imaging results that were available during my care of the patient were reviewed by me and considered in my medical decision making (see chart for details).     Patient presents with cough, congestion, body aches, and fatigue.  Symptoms consistent with mild influenza or other viral illness. The patient was given instructions for home care as well as return precautions. Patient voices understanding of these instructions, accepts the plan, and is comfortable with discharge.  Final Clinical Impressions(s) / ED Diagnoses   Final diagnoses:  Influenza-like illness    ED Discharge Orders        Ordered    oseltamivir (TAMIFLU) 75 MG capsule  Every 12 hours     05/07/17 1423    benzonatate (TESSALON) 200 MG capsule  Every 8 hours     05/07/17 1423       Anselm PancoastJoy, Shawn C, PA-C 05/07/17 2203    Alvira MondaySchlossman, Erin, MD 05/08/17 1842

## 2017-05-07 NOTE — ED Triage Notes (Signed)
Pt comes in with complaints of fever, cough, chills, and congestion that started on Friday.  Denies being around anyone that has been sick. Denies N&V. Does endorse having lesser appetite and energy.  Ambulatory. A&O x4.

## 2017-05-12 ENCOUNTER — Other Ambulatory Visit: Payer: Self-pay | Admitting: Family

## 2017-05-12 ENCOUNTER — Ambulatory Visit: Payer: BC Managed Care – PPO | Admitting: Family

## 2017-05-12 ENCOUNTER — Encounter: Payer: Self-pay | Admitting: Family

## 2017-05-12 VITALS — BP 124/86 | HR 92 | Temp 98.8°F | Wt 227.4 lb

## 2017-05-12 DIAGNOSIS — Z720 Tobacco use: Secondary | ICD-10-CM | POA: Diagnosis not present

## 2017-05-12 DIAGNOSIS — J209 Acute bronchitis, unspecified: Secondary | ICD-10-CM

## 2017-05-12 MED ORDER — AZITHROMYCIN 250 MG PO TABS: ORAL_TABLET | ORAL | 0 refills | Status: DC

## 2017-05-12 MED ORDER — ALBUTEROL SULFATE HFA 108 (90 BASE) MCG/ACT IN AERS: 2.0000 | INHALATION_SPRAY | Freq: Four times a day (QID) | RESPIRATORY_TRACT | 0 refills | Status: DC | PRN

## 2017-05-12 MED ORDER — BENZONATATE 200 MG PO CAPS: 200.0000 mg | ORAL_CAPSULE | Freq: Three times a day (TID) | ORAL | 0 refills | Status: DC | PRN

## 2017-05-12 NOTE — Patient Instructions (Signed)
Steps to Quit Smoking Smoking tobacco can be bad for your health. It can also affect almost every organ in your body. Smoking puts you and people around you at risk for many serious long-lasting (chronic) diseases. Quitting smoking is hard, but it is one of the best things that you can do for your health. It is never too late to quit. What are the benefits of quitting smoking? When you quit smoking, you lower your risk for getting serious diseases and conditions. They can include:  Lung cancer or lung disease.  Heart disease.  Stroke.  Heart attack.  Not being able to have children (infertility).  Weak bones (osteoporosis) and broken bones (fractures).  If you have coughing, wheezing, and shortness of breath, those symptoms may get better when you quit. You may also get sick less often. If you are pregnant, quitting smoking can help to lower your chances of having a baby of low birth weight. What can I do to help me quit smoking? Talk with your doctor about what can help you quit smoking. Some things you can do (strategies) include:  Quitting smoking totally, instead of slowly cutting back how much you smoke over a period of time.  Going to in-person counseling. You are more likely to quit if you go to many counseling sessions.  Using resources and support systems, such as: ? Online chats with a counselor. ? Phone quitlines. ? Printed self-help materials. ? Support groups or group counseling. ? Text messaging programs. ? Mobile phone apps or applications.  Taking medicines. Some of these medicines may have nicotine in them. If you are pregnant or breastfeeding, do not take any medicines to quit smoking unless your doctor says it is okay. Talk with your doctor about counseling or other things that can help you.  Talk with your doctor about using more than one strategy at the same time, such as taking medicines while you are also going to in-person counseling. This can help make  quitting easier. What things can I do to make it easier to quit? Quitting smoking might feel very hard at first, but there is a lot that you can do to make it easier. Take these steps:  Talk to your family and friends. Ask them to support and encourage you.  Call phone quitlines, reach out to support groups, or work with a counselor.  Ask people who smoke to not smoke around you.  Avoid places that make you want (trigger) to smoke, such as: ? Bars. ? Parties. ? Smoke-break areas at work.  Spend time with people who do not smoke.  Lower the stress in your life. Stress can make you want to smoke. Try these things to help your stress: ? Getting regular exercise. ? Deep-breathing exercises. ? Yoga. ? Meditating. ? Doing a body scan. To do this, close your eyes, focus on one area of your body at a time from head to toe, and notice which parts of your body are tense. Try to relax the muscles in those areas.  Download or buy apps on your mobile phone or tablet that can help you stick to your quit plan. There are many free apps, such as QuitGuide from the CDC (Centers for Disease Control and Prevention). You can find more support from smokefree.gov and other websites.  This information is not intended to replace advice given to you by your health care provider. Make sure you discuss any questions you have with your health care provider. Document Released: 03/05/2009 Document   Revised: 01/05/2016 Document Reviewed: 09/23/2014 Elsevier Interactive Patient Education  2018 Elsevier Inc.  

## 2017-05-12 NOTE — Progress Notes (Signed)
[name] is a 41 y.o. female with the following history as recorded in [employer]:  Patient Active Problem List   Diagnosis Date Noted  . Obesity 09/19/2016  . Cough 09/19/2016  . Headache 03/22/2016  . Nicotine dependence 03/22/2016    Current Outpatient Medications  Medication Sig Dispense Refill  . aspirin-acetaminophen-caffeine (EXCEDRIN MIGRAINE) 250-250-65 MG tablet Take 1 tablet by mouth every 6 (six) hours as needed for headache. 30 tablet 0  . albuterol (PROVENTIL HFA;VENTOLIN HFA) 108 (90 Base) MCG/ACT inhaler Inhale 2 puffs into the lungs every 6 (six) hours as needed for wheezing or shortness of breath. 1 Inhaler 0  . azithromycin (ZITHROMAX) 250 MG tablet 2 tabs po qd x 1 day; 1 tablet per day x 4 days; 6 tablet 0  . benzonatate (TESSALON) 200 MG capsule Take 1 capsule (200 mg total) by mouth 3 (three) times daily as needed for cough. 20 capsule 0   No current facility-administered medications for this visit.     Allergies: Patient has no known allergies.  Past Medical History:  Diagnosis Date  . Detached retina, left     Past Surgical History:  Procedure Laterality Date  . EYE SURGERY Left    detached visiion    Family History  Adopted: Yes  Problem Relation Age of Onset  . Hypertension Mother     Social History   Tobacco Use  . Smoking status: Current Some Day Smoker  . Smokeless tobacco: Never Used  Substance Use Topics  . Alcohol use: Yes    Comment: occasionally     Subjective:  Seen at ER on Sunday with flu-like symptoms; was started on Tamiflu with some benefit; fever and body aches have resolved but worried about the persisting cough/ congestion; "I sound like Darth Vader." does occasionally hear herself wheezing; has been taking [employer]on Perles with limited benefit; does smoke 1 ppd;   Objective:  Vitals:   05/12/17 1402  BP: 124/86  Pulse: 92  Temp: 98.8 F (37.1 C)  TempSrc: Oral  SpO2: 97%  Weight: 227 lb 6.4 oz (103.1 kg)     General: Well developed, well nourished, in no acute distress  Skin : Warm and dry.  Head: Normocephalic and atraumatic  Eyes: Sclera and conjunctiva clear; pupils round and reactive to light; extraocular movements intact  Ears: External normal; canals clear; tympanic membranes normal  Oropharynx: Pink, supple. No suspicious lesions  Neck: Supple without thyromegaly, adenopathy  Lungs: Respirations unlabored; wheezing noted in upper and lower lobes; no rales CVS exam: normal rate and regular rhythm.  Neurologic: Alert and oriented; speech intact; face symmetrical; moves all extremities well; CNII-XII intact without focal deficit  Assessment:  1. Acute bronchitis, unspecified organism   2. Tobacco abuse     Plan:  1. Most likely secondary to recent flu; okay to stop Tamiflu; Rx for Z-pak, albuterol and Tessalon Perles; increase fluids, rest and follow-up worse, no better; to consider CXR and/or oral steroids if symptoms persist.   2. Stressed need to quit smoking; information given for her to read.  Return for CPE with Dr. Lawerance BachBurns.  No orders of the defined types were placed in this encounter.   Requested Prescriptions   Signed Prescriptions Disp Refills  . azithromycin (ZITHROMAX) 250 MG tablet 6 tablet 0    Sig: 2 tabs po qd x 1 day; 1 tablet per day x 4 days;  . albuterol (PROVENTIL HFA;VENTOLIN HFA) 108 (90 Base) MCG/ACT inhaler 1 Inhaler 0  Sig: Inhale 2 puffs into the lungs every 6 (six) hours as needed for wheezing or shortness of breath.  . benzonatate (TESSALON) 200 MG capsule 20 capsule 0    Sig: Take 1 capsule (200 mg total) by mouth 3 (three) times daily as needed for cough.

## 2017-06-06 NOTE — Progress Notes (Signed)
Subjective:    Patient ID: Cheyenne [name], female    DOB: 01/29/1976, 42 y.o.   MRN: [id number]  HPI She is here for a physical exam.     Medications and allergies reviewed with patient and updated if appropriate.  Patient Active Problem List   Diagnosis Date Noted  . Obesity 09/19/2016  . Cough 09/19/2016  . Headache 03/22/2016  . Nicotine dependence 03/22/2016    Current Outpatient Medications on File Prior to Visit  Medication Sig Dispense Refill  . albuterol (PROVENTIL HFA;VENTOLIN HFA) 108 (90 Base) MCG/ACT inhaler Inhale 2 puffs into the lungs every 6 (six) hours as needed for wheezing or shortness of breath. 1 Inhaler 0  . aspirin-acetaminophen-caffeine (EXCEDRIN MIGRAINE) 250-250-65 MG tablet Take 1 tablet by mouth every 6 (six) hours as needed for headache. 30 tablet 0  . azithromycin (ZITHROMAX) 250 MG tablet 2 tabs po qd x 1 day; 1 tablet per day x 4 days; 6 tablet 0  . benzonatate (TESSALON) 200 MG capsule Take 1 capsule (200 mg total) by mouth 3 (three) times daily as needed for cough. 20 capsule 0   No current facility-administered medications on file prior to visit.     Past Medical History:  Diagnosis Date  . Detached retina, left     Past Surgical History:  Procedure Laterality Date  . EYE SURGERY Left    detached visiion    Social History   Socioeconomic History  . Marital status: Single    Spouse name: Not on file  . Number of children: Not on file  . Years of education: Not on file  . Highest education level: Not on file  Social Needs  . Financial resource strain: Not on file  . Food insecurity - worry: Not on file  . Food insecurity - inability: Not on file  . Transportation needs - medical: Not on file  . Transportation needs - non-medical: Not on file  Occupational History  . Not on file  Tobacco Use  . Smoking status: Current Some Day Smoker  . Smokeless tobacco: Never Used  Substance and Sexual Activity  . Alcohol use: Yes   Comment: occasionally   . Drug use: No  . Sexual activity: Not on file  Other Topics Concern  . Not on file  Social History Narrative   Works at [employer]   No regular exercise - walks a lot at work    Family History  Adopted: Yes  Problem Relation Age of Onset  . Hypertension Mother     Review of Systems     Objective:  There were no vitals filed for this visit. There were no vitals filed for this visit. There is no height or weight on file to calculate BMI.  Wt Readings from Last 3 Encounters:  05/12/17 227 lb 6.4 oz (103.1 kg)  05/07/17 220 lb (99.8 kg)  09/19/16 225 lb (102.1 kg)     Physical Exam Constitutional: She appears well-developed and well-nourished. No distress.  HENT:  Head: Normocephalic and atraumatic.  Right Ear: External ear normal. Normal ear canal and TM Left Ear: External ear normal.  Normal ear canal and TM Mouth/Throat: Oropharynx is clear and moist.  Eyes: Conjunctivae and EOM are normal.  Neck: Neck supple. No tracheal deviation present. No thyromegaly present.  No carotid bruit  Cardiovascular: Normal rate, regular rhythm and normal heart sounds.   No murmur heard.  No edema. Pulmonary/Chest: Effort normal and breath sounds normal. No respiratory  distress. She has no wheezes. She has no rales.  Breast: deferred to Gyn Abdominal: Soft. She exhibits no distension. There is no tenderness.  Lymphadenopathy: She has no cervical adenopathy.  Skin: Skin is warm and dry. She is not diaphoretic.  Psychiatric: She has a normal mood and affect. Her behavior is normal.        Assessment & Plan:   Physical exam: Screening blood work   ordered Immunizations Mammogram Gyn Eye exams Exercise Weight Skin  Substance abuse  See Problem List for Assessment and Plan of chronic medical problems.       This encounter was created in error - please disregard.

## 2017-06-06 NOTE — Patient Instructions (Signed)
Test(s) ordered today. Your results will be released to [place] (or called to you) after review, usually within 72hours after test completion. If any changes need to be made, you will be notified at that same time.  All other Health Maintenance issues reviewed.   All recommended immunizations and age-appropriate screenings are up-to-date or discussed.  No immunizations administered today.   Medications reviewed and updated.  Changes include  /  No changes recommended at this time.  Your prescription(s) have been submitted to your pharmacy. Please take as directed and contact our office if you believe you are having problem(s) with the medication(s).  A referral was ordered for   Please followup in    Health Maintenance, Female Adopting a healthy lifestyle and getting preventive care can go a long way to promote health and wellness. Talk with your health care provider about what schedule of regular examinations is right for you. This is a good chance for you to check in with your provider about disease prevention and staying healthy. In between checkups, there are plenty of things you can do on your own. Experts have done a lot of research about which lifestyle changes and preventive measures are most likely to keep you healthy. Ask your health care provider for more information. Weight and diet Eat a healthy diet  Be sure to include plenty of vegetables, fruits, low-fat dairy products, and lean protein.  Do not eat a lot of foods high in solid fats, added sugars, or salt.  Get regular exercise. This is one of the most important things you can do for your health. ? Most adults should exercise for at least 150 minutes each week. The exercise should increase your heart rate and make you sweat (moderate-intensity exercise). ? Most adults should also do strengthening exercises at least twice a week. This is in addition to the moderate-intensity exercise.  Maintain a healthy weight  Body  mass index (BMI) is a measurement that can be used to identify possible weight problems. It estimates body fat based on height and weight. Your health care provider can help determine your BMI and help you achieve or maintain a healthy weight.  For females 80 years of age and older: ? A BMI below 18.5 is considered underweight. ? A BMI of 18.5 to 24.9 is normal. ? A BMI of 25 to 29.9 is considered overweight. ? A BMI of 30 and above is considered obese.  Watch levels of cholesterol and blood lipids  You should start having your blood tested for lipids and cholesterol at 42 years of age, then have this test every 5 years.  You may need to have your cholesterol levels checked more often if: ? Your lipid or cholesterol levels are high. ? You are older than 42 years of age. ? You are at high risk for heart disease.  Cancer screening Lung Cancer  Lung cancer screening is recommended for adults 61-48 years old who are at high risk for lung cancer because of a history of smoking.  A yearly low-dose CT scan of the lungs is recommended for people who: ? Currently smoke. ? Have quit within the past 15 years. ? Have at least a 30-pack-year history of smoking. A pack year is smoking an average of one pack of cigarettes a day for 1 year.  Yearly screening should continue until it has been 15 years since you quit.  Yearly screening should stop if you develop a health problem that would prevent you from having  lung cancer treatment.  Breast Cancer  Practice breast self-awareness. This means understanding how your breasts normally appear and feel.  It also means doing regular breast self-exams. Let your health care provider know about any changes, no matter how small.  If you are in your 20s or 30s, you should have a clinical breast exam (CBE) by a health care provider every 1-3 years as part of a regular health exam.  If you are 40 or older, have a CBE every year. Also consider having a  breast X-ray (mammogram) every year.  If you have a family history of breast cancer, talk to your health care provider about genetic screening.  If you are at high risk for breast cancer, talk to your health care provider about having an MRI and a mammogram every year.  Breast cancer gene (BRCA) assessment is recommended for women who have family members with BRCA-related cancers. BRCA-related cancers include: ? Breast. ? Ovarian. ? Tubal. ? Peritoneal cancers.  Results of the assessment will determine the need for genetic counseling and BRCA1 and BRCA2 testing.  Cervical Cancer Your health care provider may recommend that you be screened regularly for cancer of the pelvic organs (ovaries, uterus, and vagina). This screening involves a pelvic examination, including checking for microscopic changes to the surface of your cervix (Pap test). You may be encouraged to have this screening done every 3 years, beginning at age 21.  For women ages 30-65, health care providers may recommend pelvic exams and Pap testing every 3 years, or they may recommend the Pap and pelvic exam, combined with testing for human papilloma virus (HPV), every 5 years. Some types of HPV increase your risk of cervical cancer. Testing for HPV may also be done on women of any age with unclear Pap test results.  Other health care providers may not recommend any screening for nonpregnant women who are considered low risk for pelvic cancer and who do not have symptoms. Ask your health care provider if a screening pelvic exam is right for you.  If you have had past treatment for cervical cancer or a condition that could lead to cancer, you need Pap tests and screening for cancer for at least 20 years after your treatment. If Pap tests have been discontinued, your risk factors (such as having a new sexual partner) need to be reassessed to determine if screening should resume. Some women have medical problems that increase the chance  of getting cervical cancer. In these cases, your health care provider may recommend more frequent screening and Pap tests.  Colorectal Cancer  This type of cancer can be detected and often prevented.  Routine colorectal cancer screening usually begins at 42 years of age and continues through 42 years of age.  Your health care provider may recommend screening at an earlier age if you have risk factors for colon cancer.  Your health care provider may also recommend using home test kits to check for hidden blood in the stool.  A small camera at the end of a tube can be used to examine your colon directly (sigmoidoscopy or colonoscopy). This is done to check for the earliest forms of colorectal cancer.  Routine screening usually begins at age 50.  Direct examination of the colon should be repeated every 5-10 years through 42 years of age. However, you may need to be screened more often if early forms of precancerous polyps or small growths are found.  Skin Cancer  Check your skin from head to   toe regularly.  Tell your health care provider about any new moles or changes in moles, especially if there is a change in a mole's shape or color.  Also tell your health care provider if you have a mole that is larger than the size of a pencil eraser.  Always use sunscreen. Apply sunscreen liberally and repeatedly throughout the day.  Protect yourself by wearing long sleeves, pants, a wide-brimmed hat, and sunglasses whenever you are outside.  Heart disease, diabetes, and high blood pressure  High blood pressure causes heart disease and increases the risk of stroke. High blood pressure is more likely to develop in: ? People who have blood pressure in the high end of the normal range (130-139/85-89 mm Hg). ? People who are overweight or obese. ? People who are African American.  If you are 12-70 years of age, have your blood pressure checked every 3-5 years. If you are 37 years of age or older,  have your blood pressure checked every year. You should have your blood pressure measured twice-once when you are at a hospital or clinic, and once when you are not at a hospital or clinic. Record the average of the two measurements. To check your blood pressure when you are not at a hospital or clinic, you can use: ? An automated blood pressure machine at a pharmacy. ? A home blood pressure monitor.  If you are between 56 years and 70 years old, ask your health care provider if you should take aspirin to prevent strokes.  Have regular diabetes screenings. This involves taking a blood sample to check your fasting blood sugar level. ? If you are at a normal weight and have a low risk for diabetes, have this test once every three years after 42 years of age. ? If you are overweight and have a high risk for diabetes, consider being tested at a younger age or more often. Preventing infection Hepatitis B  If you have a higher risk for hepatitis B, you should be screened for this virus. You are considered at high risk for hepatitis B if: ? You were born in a country where hepatitis B is common. Ask your health care provider which countries are considered high risk. ? Your parents were born in a high-risk country, and you have not been immunized against hepatitis B (hepatitis B vaccine). ? You have HIV or AIDS. ? You use needles to inject street drugs. ? You live with someone who has hepatitis B. ? You have had sex with someone who has hepatitis B. ? You get hemodialysis treatment. ? You take certain medicines for conditions, including cancer, organ transplantation, and autoimmune conditions.  Hepatitis C  Blood testing is recommended for: ? Everyone born from [birth year] through 1965. ? Anyone with known risk factors for hepatitis C.  Sexually transmitted infections (STIs)  You should be screened for sexually transmitted infections (STIs) including gonorrhea and chlamydia if: ? You are sexually  active and are younger than 42 years of age. ? You are older than 42 years of age and your health care provider tells you that you are at risk for this type of infection. ? Your sexual activity has changed since you were last screened and you are at an increased risk for chlamydia or gonorrhea. Ask your health care provider if you are at risk.  If you do not have HIV, but are at risk, it may be recommended that you take a prescription medicine daily to prevent HIV infection.  This is called pre-exposure prophylaxis (PrEP). You are considered at risk if: ? You are sexually active and do not regularly use condoms or know the HIV status of your partner(s). ? You take drugs by injection. ? You are sexually active with a partner who has HIV.  Talk with your health care provider about whether you are at high risk of being infected with HIV. If you choose to begin PrEP, you should first be tested for HIV. You should then be tested every 3 months for as long as you are taking PrEP. Pregnancy  If you are premenopausal and you may become pregnant, ask your health care provider about preconception counseling.  If you may become pregnant, take 400 to 800 micrograms (mcg) of folic acid every day.  If you want to prevent pregnancy, talk to your health care provider about birth control (contraception). Osteoporosis and menopause  Osteoporosis is a disease in which the bones lose minerals and strength with aging. This can result in serious bone fractures. Your risk for osteoporosis can be identified using a bone density scan.  If you are 73 years of age or older, or if you are at risk for osteoporosis and fractures, ask your health care provider if you should be screened.  Ask your health care provider whether you should take a calcium or vitamin D supplement to lower your risk for osteoporosis.  Menopause may have certain physical symptoms and risks.  Hormone replacement therapy may reduce some of these  symptoms and risks. Talk to your health care provider about whether hormone replacement therapy is right for you. Follow these instructions at home:  Schedule regular health, dental, and eye exams.  Stay current with your immunizations.  Do not use any tobacco products including cigarettes, chewing tobacco, or electronic cigarettes.  If you are pregnant, do not drink alcohol.  If you are breastfeeding, limit how much and how often you drink alcohol.  Limit alcohol intake to no more than 1 drink per day for nonpregnant women. One drink equals 12 ounces of beer, 5 ounces of wine, or 1 ounces of hard liquor.  Do not use street drugs.  Do not share needles.  Ask your health care provider for help if you need support or information about quitting drugs.  Tell your health care provider if you often feel depressed.  Tell your health care provider if you have ever been abused or do not feel safe at home. This information is not intended to replace advice given to you by your health care provider. Make sure you discuss any questions you have with your health care provider. Document Released: 11/22/2010 Document Revised: 10/15/2015 Document Reviewed: 02/10/2015 Elsevier Interactive Patient Education  [employer].

## 2017-06-07 ENCOUNTER — Encounter: Payer: BC Managed Care – PPO | Admitting: Internal Medicine

## 2018-02-26 ENCOUNTER — Emergency Department (HOSPITAL_COMMUNITY)
Admission: EM | Admit: 2018-02-26 | Discharge: 2018-02-26 | Disposition: A | Discharge status: Home / Self Care | Payer: BC Managed Care – PPO | Attending: Emergency Medicine | Admitting: Emergency Medicine

## 2018-02-26 ENCOUNTER — Encounter (HOSPITAL_COMMUNITY): Payer: Self-pay | Admitting: Emergency Medicine

## 2018-02-26 ENCOUNTER — Other Ambulatory Visit: Payer: Self-pay

## 2018-02-26 DIAGNOSIS — F17210 Nicotine dependence, cigarettes, uncomplicated: Secondary | ICD-10-CM | POA: Insufficient documentation

## 2018-02-26 DIAGNOSIS — Z79899 Other long term (current) drug therapy: Secondary | ICD-10-CM | POA: Insufficient documentation

## 2018-02-26 DIAGNOSIS — N390 Urinary tract infection, site not specified: Secondary | ICD-10-CM | POA: Insufficient documentation

## 2018-02-26 LAB — URINALYSIS, ROUTINE W REFLEX MICROSCOPIC
BILIRUBIN URINE: NEGATIVE
Bacteria, UA: NONE SEEN
GLUCOSE, UA: NEGATIVE mg/dL
Ketones, ur: NEGATIVE mg/dL
Nitrite: POSITIVE — AB
Protein, ur: 100 mg/dL — AB
SPECIFIC GRAVITY, URINE: 1.02 (ref 1.005–1.030)
WBC, UA: >50 WBC/hpf — ABNORMAL HIGH (ref 0–5)
pH: 5 (ref 5.0–8.0)

## 2018-02-26 LAB — POC URINE PREG, ED: PREG TEST UR: NEGATIVE

## 2018-02-26 MED ORDER — NITROFURANTOIN MONOHYD MACRO 100 MG PO CAPS: 100.0000 mg | ORAL_CAPSULE | Freq: Two times a day (BID) | ORAL | 0 refills | Status: DC

## 2018-02-26 MED ORDER — NITROFURANTOIN MONOHYD MACRO 100 MG PO CAPS
100.0000 mg | ORAL_CAPSULE | Freq: Once | ORAL | Status: AC
  Administered 2018-02-26: 100 mg via ORAL
  Filled 2018-02-26: qty 1

## 2018-02-26 NOTE — Discharge Instructions (Addendum)
If you develop fever, flank or back pain, nausea or vomiting, or your symptoms do not improve after about 24-48 hours of antibiotics then return to the ER for evaluation.  If her symptoms do improve, make sure he finished the antibiotics, even if you are feeling better.  Follow-up with your primary care physician.

## 2018-02-26 NOTE — ED Triage Notes (Signed)
Pt c/o urinary frequency and still feels like needs to urinate after she pees. Denies blood or pain with urination.

## 2018-02-26 NOTE — ED Provider Notes (Signed)
Cheyenne [name]   CSN: [id number] Arrival date & time: 02/26/18  4098     History   Chief Complaint Chief Complaint  Patient presents with  . Urinary Frequency    HPI Cheyenne [name] is a 42 y.o. female.  HPI  42 year old female presents with urinary frequency.  Has been ongoing for about 3 days.  She is also having urgency and feeling like she does not completely empty her bladder.  There is no nausea, vomiting, flank or back pain.  Some lower abdominal pressure, especially when urinating.  No fevers.  Patient states that the pressure sometimes worsens when she urinates and otherwise is present throughout the day.  No new vaginal discharge or vaginal bleeding.  No significant concern for STI.  Past Medical History:  Diagnosis Date  . Detached retina, left     Patient Active Problem List   Diagnosis Date Noted  . Obesity 09/19/2016  . Cough 09/19/2016  . Headache 03/22/2016  . Nicotine dependence 03/22/2016    Past Surgical History:  Procedure Laterality Date  . EYE SURGERY Left    detached visiion     OB History   None      Home Medications    Prior to Admission medications   Medication Sig Start Date End Date Taking? Authorizing Provider  albuterol (PROVENTIL HFA;VENTOLIN HFA) 108 (90 Base) MCG/ACT inhaler Inhale 2 puffs into the lungs every 6 (six) hours as needed for wheezing or shortness of breath. 05/12/17   Olive Bass, FNP  aspirin-acetaminophen-caffeine (EXCEDRIN MIGRAINE) [phone] MG tablet Take 1 tablet by mouth every 6 (six) hours as needed for headache. 03/22/16   Pincus Sanes, MD  nitrofurantoin, macrocrystal-monohydrate, (MACROBID) 100 MG capsule Take 1 capsule (100 mg total) by mouth 2 (two) times daily. 02/27/18   Pricilla Loveless, MD    Family History Family History  Adopted: Yes  Problem Relation Age of Onset  . Hypertension Mother     Social History Social History   Tobacco  Use  . Smoking status: Current Some Day Smoker    Types: Cigarettes  . Smokeless tobacco: Never Used  Substance Use Topics  . Alcohol use: Yes    Comment: occasionally   . Drug use: No     Allergies   Patient has no known allergies.   Review of Systems Review of Systems  Constitutional: Negative for fever.  Gastrointestinal: Positive for abdominal pain (pressure). Negative for nausea and vomiting.  Genitourinary: Positive for frequency and urgency. Negative for dysuria, flank pain, vaginal bleeding and vaginal discharge.  Musculoskeletal: Negative for back pain.  All other systems reviewed and are negative.    Physical Exam Updated Vital Signs BP (!) 127/96 (BP Location: Left Arm)   Pulse 88   Temp 98.1 F (36.7 C) (Oral)   Resp 16   Ht 5\' 6"  (1.676 m)   Wt 99.8 kg   LMP 02/03/2018   SpO2 100%   BMI 35.51 kg/m   Physical Exam  Constitutional: She appears well-developed and well-nourished.  obese  HENT:  Head: Normocephalic and atraumatic.  Right Ear: External ear normal.  Left Ear: External ear normal.  Nose: Nose normal.  Eyes: Right eye exhibits no discharge. Left eye exhibits no discharge.  Cardiovascular: Normal rate, regular rhythm and normal heart sounds.  Pulmonary/Chest: Effort normal and breath sounds normal.  Abdominal: Soft. There is no tenderness. There is no CVA tenderness.  Neurological: She is alert.  Skin: Skin  is warm and dry.  Psychiatric: She expresses no suicidal ideation.  Nursing [name] and vitals reviewed.    ED Treatments / Results  Labs (all labs ordered are listed, but only abnormal results are displayed) Labs Reviewed  URINALYSIS, ROUTINE W REFLEX MICROSCOPIC - Abnormal; Notable for the following components:      Result Value   APPearance CLOUDY (*)    Hgb urine dipstick MODERATE (*)    Protein, ur 100 (*)    Nitrite POSITIVE (*)    Leukocytes, UA LARGE (*)    WBC, UA >50 (*)    Non Squamous Epithelial 0-5 (*)    All  other components within normal limits  URINE CULTURE  POC URINE PREG, ED    EKG None  Radiology No results found.  Procedures Procedures (including critical care time)  Medications Ordered in ED Medications  nitrofurantoin (macrocrystal-monohydrate) (MACROBID) capsule 100 mg (100 mg Oral Given 02/26/18 2006)     Initial Impression / Assessment and Plan / ED Course  I have reviewed the triage vital signs and the nursing notes.  Pertinent labs & imaging results that were available during my care of the patient were reviewed by me and considered in my medical decision making (see chart for details).     Given presentation combined with urinalysis, I think she has an acute urinary tract infection.  This appears to be localized to the bladder and has no signs or symptoms of pyelonephritis.  While there are squamous epithelial cells in her urine, with nitrites, leukocytes, and her history, I think this is a UTI.  Given it appears simple, treat with Macrobid.  We discussed strict return precautions.  Vital signs are unremarkable.  Final Clinical Impressions(s) / ED Diagnoses   Final diagnoses:  Acute lower urinary tract infection    ED Discharge Orders         Ordered    nitrofurantoin, macrocrystal-monohydrate, (MACROBID) 100 MG capsule  2 times daily     02/26/18 1929           Pricilla Loveless, MD 02/26/18 2019

## 2018-03-01 LAB — URINE CULTURE: Culture: >=100000 — AB

## 2018-03-02 ENCOUNTER — Telehealth: Payer: Self-pay | Admitting: *Deleted

## 2018-03-02 NOTE — Telephone Encounter (Signed)
Post ED Visit - Positive Culture Follow-up  Culture report reviewed by antimicrobial stewardship pharmacist:  []  Enzo Bi, Pharm.D. []  Celedonio Miyamoto, Pharm.D., BCPS AQ-ID []  Garvin Fila, Pharm.D., BCPS []  Georgina Pillion, Pharm.D., BCPS []  [place], [place].D., BCPS, AAHIVP []  Estella Husk, Pharm.D., BCPS, AAHIVP []  Lysle Pearl, PharmD, BCPS []  Phillips Climes, PharmD, BCPS []  Agapito Games, PharmD, BCPS []  Verlan Friends, PharmD Danae Orleans, PharmD  Positive urine culture Treated with Nitrofurantoin Monohyd Macro, organism sensitive to the same and no further patient follow-up is required at this time.  [facility] 03/02/2018, 11:47 AM

## 2019-09-07 ENCOUNTER — Encounter (HOSPITAL_COMMUNITY): Payer: Self-pay

## 2019-09-07 ENCOUNTER — Emergency Department (HOSPITAL_COMMUNITY)
Admission: EM | Admit: 2019-09-07 | Discharge: 2019-09-07 | Disposition: A | Discharge status: Home / Self Care | Payer: 59 | Attending: Emergency Medicine | Admitting: Emergency Medicine

## 2019-09-07 ENCOUNTER — Other Ambulatory Visit: Payer: Self-pay

## 2019-09-07 ENCOUNTER — Emergency Department (HOSPITAL_COMMUNITY): Payer: 59

## 2019-09-07 DIAGNOSIS — R072 Precordial pain: Secondary | ICD-10-CM | POA: Diagnosis not present

## 2019-09-07 DIAGNOSIS — R0602 Shortness of breath: Secondary | ICD-10-CM | POA: Diagnosis not present

## 2019-09-07 DIAGNOSIS — I10 Essential (primary) hypertension: Secondary | ICD-10-CM | POA: Diagnosis not present

## 2019-09-07 DIAGNOSIS — F17210 Nicotine dependence, cigarettes, uncomplicated: Secondary | ICD-10-CM | POA: Diagnosis not present

## 2019-09-07 DIAGNOSIS — R0789 Other chest pain: Secondary | ICD-10-CM | POA: Diagnosis present

## 2019-09-07 DIAGNOSIS — Z5320 Procedure and treatment not carried out because of patient's decision for unspecified reasons: Secondary | ICD-10-CM | POA: Diagnosis not present

## 2019-09-07 LAB — TSH: TSH: 1.016 u[IU]/mL (ref 0.350–4.500)

## 2019-09-07 LAB — D-DIMER, QUANTITATIVE: D-Dimer, Quant: 1.27 ug/mL-FEU — ABNORMAL HIGH (ref 0.00–0.50)

## 2019-09-07 LAB — CBC
HCT: 41 % (ref 36.0–46.0)
Hemoglobin: 13.1 g/dL (ref 12.0–15.0)
MCH: 26 pg (ref 26.0–34.0)
MCHC: 32 g/dL (ref 30.0–36.0)
MCV: 81.3 fL (ref 80.0–100.0)
Platelets: 415 10*3/uL — ABNORMAL HIGH (ref 150–400)
RBC: 5.04 MIL/uL (ref 3.87–5.11)
RDW: 19 % — ABNORMAL HIGH (ref 11.5–15.5)
WBC: 15.8 10*3/uL — ABNORMAL HIGH (ref 4.0–10.5)
nRBC: 0 % (ref 0.0–0.2)

## 2019-09-07 LAB — I-STAT BETA HCG BLOOD, ED (NOT ORDERABLE): I-stat hCG, quantitative: <5 m[IU]/mL (ref <5)

## 2019-09-07 LAB — BASIC METABOLIC PANEL
Anion gap: 13 (ref 5–15)
BUN: 10 mg/dL (ref 6–20)
CO2: 19 mmol/L — ABNORMAL LOW (ref 22–32)
Calcium: 9.1 mg/dL (ref 8.9–10.3)
Chloride: 106 mmol/L (ref 98–111)
Creatinine, Ser: 0.78 mg/dL (ref 0.44–1.00)
GFR calc Af Amer: >60 mL/min (ref >60)
GFR calc non Af Amer: >60 mL/min (ref >60)
Glucose, Bld: 95 mg/dL (ref 70–99)
Potassium: 3.2 mmol/L — ABNORMAL LOW (ref 3.5–5.1)
Sodium: 138 mmol/L (ref 135–145)

## 2019-09-07 LAB — TROPONIN I (HIGH SENSITIVITY)
Troponin I (High Sensitivity): 2 ng/L (ref <18)
Troponin I (High Sensitivity): 3 ng/L (ref <18)

## 2019-09-07 LAB — T4, FREE: Free T4: 1.11 ng/dL (ref 0.61–1.12)

## 2019-09-07 NOTE — ED Notes (Signed)
Pt states at the current moment she is relaxed so she is not actively experiencing the chest pain.

## 2019-09-07 NOTE — ED Notes (Signed)
Pt lying in bed. Family at bedside. Full monitor on. Pt denies any needs. Will continue to monitor.

## 2019-09-07 NOTE — ED Provider Notes (Signed)
[facility]-EMERGENCY DEPT Provider Note   CSN: [id number] Arrival date & time: 09/07/19  0103     History Chief Complaint  Patient presents with  . Chest Pain    Cheyenne [name] is a 44 y.o. female.  The history is provided by the patient.  Chest Pain Pain location:  Substernal area Pain quality: pressure   Pain radiates to:  Does not radiate Pain severity:  Moderate Onset quality:  Sudden Timing:  Constant Progression:  Unchanged Chronicity:  New Relieved by:  None tried Worsened by:  Nothing Associated symptoms: palpitations and shortness of breath   Associated symptoms: no fever, no lower extremity edema and no vomiting     HPI: A 44 year old patient with a history of hypertension presents for evaluation of chest pain. Initial onset of pain was approximately 1-3 hours ago. The patient's chest pain is described as heaviness/pressure/tightness and is not worse with exertion. The patient's chest pain is middle- or left-sided, is not well-localized, is not sharp and does not radiate to the arms/jaw/neck. The patient does not complain of nausea and denies diaphoresis. The patient has smoked in the past 90 days and has a family history of coronary artery disease in a first-degree relative with onset less than age 79. The patient has no history of stroke, has no history of peripheral artery disease, denies any history of treated diabetes, has no history of hypercholesterolemia and does not have an elevated BMI (>=30).  Patient with history of hypertension presents with chest pain. She reports this started at rest.  She has never had this before She reports she feels her heart racing She is a smoker.  She uses alcohol occasionally.  No drug abuse, no caffeine abuse no history of thyroid disease.  No history of CAD/VTE No hemoptysis Past Medical History:  Diagnosis Date  . Detached retina, left   . Hypertension     Patient Active Problem List   Diagnosis  Date Noted  . Obesity 09/19/2016  . Cough 09/19/2016  . Headache 03/22/2016  . Nicotine dependence 03/22/2016    Past Surgical History:  Procedure Laterality Date  . EYE SURGERY Left    detached visiion     OB History   No obstetric history on file.     Family History  Adopted: Yes  Problem Relation Age of Onset  . Hypertension Mother     Social History   Tobacco Use  . Smoking status: Current Some Day Smoker    Types: Cigarettes  . Smokeless tobacco: Never Used  Substance Use Topics  . Alcohol use: Yes    Comment: occasionally   . Drug use: No    Home Medications Prior to Admission medications   Medication Sig Start Date End Date Taking? Authorizing Provider  aspirin-acetaminophen-caffeine (EXCEDRIN MIGRAINE) [phone] MG tablet Take 1 tablet by mouth every 6 (six) hours as needed for headache or migraine.   Yes [provider]  albuterol (PROVENTIL HFA;VENTOLIN HFA) 108 (90 Base) MCG/ACT inhaler Inhale 2 puffs into the lungs every 6 (six) hours as needed for wheezing or shortness of breath. Patient not taking: Reported on 09/07/2019 05/12/17 09/07/19  Olive Bass, FNP    Allergies    Patient has no known allergies.  Review of Systems   Review of Systems  Constitutional: Negative for fever.  Respiratory: Positive for shortness of breath.   Cardiovascular: Positive for chest pain and palpitations. Negative for leg swelling.  Gastrointestinal: Negative for vomiting.  All other  systems reviewed and are negative.   Physical Exam Updated Vital Signs BP 137/78 (BP Location: Right Arm)   Pulse (!) 117   Temp 98.1 F (36.7 C) (Oral)   Resp (!) 21   Ht 1.676 m (5\' 6" )   Wt 117.9 kg   LMP 08/22/2019   SpO2 98%   BMI 41.97 kg/m   Physical Exam CONSTITUTIONAL: Well developed/well nourished HEAD: Normocephalic/atraumatic EYES: EOMI/PERRL ENMT: Mucous membranes moist NECK: supple no meningeal signs SPINE/BACK:entire spine  nontender CV: S1/S2 noted, no murmurs/rubs/gallops noted, tachycardic LUNGS: Lungs are clear to auscultation bilaterally, no apparent distress ABDOMEN: soft, nontender, no rebound or guarding, bowel sounds noted throughout abdomen GU:no cva tenderness NEURO: Pt is awake/alert/appropriate, moves all extremitiesx4.  No facial droop.  EXTREMITIES: pulses normal/equal, full ROM, no LE edema/tenderness SKIN: warm, color normal PSYCH: no abnormalities of mood noted, alert and oriented to situation  ED Results / Procedures / Treatments   Labs (all labs ordered are listed, but only abnormal results are displayed) Labs Reviewed  BASIC METABOLIC PANEL - Abnormal; Notable for the following components:      Result Value   Potassium 3.2 (*)    CO2 19 (*)    All other components within normal limits  CBC - Abnormal; Notable for the following components:   WBC 15.8 (*)    RDW 19.0 (*)    Platelets 415 (*)    All other components within normal limits  D-DIMER, QUANTITATIVE (NOT AT [facility]) - Abnormal; Notable for the following components:   D-Dimer, Quant 1.27 (*)    All other components within normal limits  TSH  T4, FREE  T3, FREE  I-STAT BETA HCG BLOOD, ED (NOT ORDERABLE)  TROPONIN I (HIGH SENSITIVITY)  TROPONIN I (HIGH SENSITIVITY)    EKG EKG Interpretation  Date/Time:  Saturday September 07 2019 01:14:20 EDT Ventricular Rate:  131 PR Interval:    QRS Duration: 101 QT Interval:  325 QTC Calculation: 480 R Axis:   -27 Text Interpretation: Sinus tachycardia Borderline left axis deviation Low voltage, precordial leads Abnormal R-wave progression, early transition No previous ECGs available Confirmed by Ripley Fraise [phone]) on 09/07/2019 1:25:48 AM   Radiology DG Chest 2 View  Result Date: 09/07/2019 CLINICAL DATA:  Sternal chest pain EXAM: CHEST - 2 VIEW COMPARISON:  None. FINDINGS: The heart size and mediastinal contours are within normal limits. Both lungs are clear. The visualized  skeletal structures are unremarkable. IMPRESSION: No active cardiopulmonary disease. Electronically Signed   By: Randa Ngo M.D.   On: 09/07/2019 01:48    Procedures Procedures   Medications Ordered in ED Medications - No data to display  ED Course  I have reviewed the triage vital signs and the nursing notes.  Pertinent labs & imaging results that were available during my care of the patient were reviewed by me and considered in my medical decision making (see chart for details).    MDM Rules/Calculators/A&P HEAR Score: 4                     This patient presents to the ED for concern of chest pain, this involves an extensive number of treatment options, and is a complaint that carries with it a high risk of complications and morbidity.  The differential diagnosis includes ACS/Dissection/PE   Lab Tests:   I Ordered, reviewed, and interpreted labs, which included troponin, cbc, bmp   Imaging Studies ordered:   I ordered imaging studies which included chest xray  and  I independently visualized and interpreted imaging which showed no acute findings    Patient seen for chest pain shortness of breath and tachycardia.  Initial troponin negative.  EKG revealed sinus tachycardia.  On reassessment patient did have some improvement in heart rate and symptoms.  However due to unclear cause of tachycardia and shortness of breath I added on a D-dimer.  This was done to rule out pulmonary embolism.  Prior to test results resulting, patient eloped from the emergency department.  Patient has elevated D-dimer.  I called and left a voicemail on her home and mobile number instruct her to return to the ER immediately Final Clinical Impression(s) / ED Diagnoses Final diagnoses:  Precordial pain    Rx / DC Orders ED Discharge Orders    None       Zadie Rhine, MD 09/07/19 [phone]

## 2019-09-07 NOTE — ED Triage Notes (Signed)
Pt states sternal chest pain that is not reproducible starting this evening. She thought it was going to get better but it kept getting worse.

## 2019-09-09 LAB — T3, FREE: T3, Free: 2.6 pg/mL (ref 2.0–4.4)

## 2019-09-30 ENCOUNTER — Encounter (HOSPITAL_COMMUNITY): Payer: Self-pay

## 2019-09-30 ENCOUNTER — Emergency Department (HOSPITAL_COMMUNITY)
Admission: EM | Admit: 2019-09-30 | Discharge: 2019-09-30 | Disposition: A | Discharge status: Home / Self Care | Payer: 59 | Attending: Emergency Medicine | Admitting: Emergency Medicine

## 2019-09-30 ENCOUNTER — Other Ambulatory Visit: Payer: Self-pay

## 2019-09-30 DIAGNOSIS — F17210 Nicotine dependence, cigarettes, uncomplicated: Secondary | ICD-10-CM | POA: Insufficient documentation

## 2019-09-30 DIAGNOSIS — Z79899 Other long term (current) drug therapy: Secondary | ICD-10-CM | POA: Insufficient documentation

## 2019-09-30 DIAGNOSIS — R42 Dizziness and giddiness: Secondary | ICD-10-CM | POA: Insufficient documentation

## 2019-09-30 DIAGNOSIS — R519 Headache, unspecified: Secondary | ICD-10-CM | POA: Insufficient documentation

## 2019-09-30 LAB — CBC
HCT: 39.4 % (ref 36.0–46.0)
Hemoglobin: 12.5 g/dL (ref 12.0–15.0)
MCH: 26.5 pg (ref 26.0–34.0)
MCHC: 31.7 g/dL (ref 30.0–36.0)
MCV: 83.7 fL (ref 80.0–100.0)
Platelets: 383 10*3/uL (ref 150–400)
RBC: 4.71 MIL/uL (ref 3.87–5.11)
RDW: 17.4 % — ABNORMAL HIGH (ref 11.5–15.5)
WBC: 12.2 10*3/uL — ABNORMAL HIGH (ref 4.0–10.5)
nRBC: 0 % (ref 0.0–0.2)

## 2019-09-30 LAB — BASIC METABOLIC PANEL
Anion gap: 10 (ref 5–15)
BUN: 14 mg/dL (ref 6–20)
CO2: 25 mmol/L (ref 22–32)
Calcium: 9.4 mg/dL (ref 8.9–10.3)
Chloride: 103 mmol/L (ref 98–111)
Creatinine, Ser: 0.84 mg/dL (ref 0.44–1.00)
GFR calc Af Amer: >60 mL/min (ref >60)
GFR calc non Af Amer: >60 mL/min (ref >60)
Glucose, Bld: 93 mg/dL (ref 70–99)
Potassium: 3.8 mmol/L (ref 3.5–5.1)
Sodium: 138 mmol/L (ref 135–145)

## 2019-09-30 LAB — HCG, QUANTITATIVE, PREGNANCY: hCG, Beta Chain, Quant, S: <1 m[IU]/mL (ref <5)

## 2019-09-30 MED ORDER — MECLIZINE HCL 50 MG PO TABS: 50.0000 mg | ORAL_TABLET | Freq: Three times a day (TID) | ORAL | 0 refills | Status: DC | PRN

## 2019-09-30 NOTE — ED Provider Notes (Signed)
[facility]-EMERGENCY DEPT Provider Note   CSN: [id number] Arrival date & time: 09/30/19  1332     History Chief Complaint  Patient presents with  . Dizziness  . Headache    Cheyenne [name] is a 44 y.o. female.  44 yo F with a chief complaints of headache.  Patient states that today she had stood up and she felt a sudden thump to the right side of her head.  This lasted for couple minutes and resolved on its own.  She was worried and so she came to the ED for evaluation.  The patient has had dizziness off and on usually occurs when she stands up.  She has been having this today as well.  Did not significantly changed with a headache.  She denies one-sided numbness or weakness denies difficulty with speech or swallowing she denies current headache or neck pain.  She denies recent head injury denies recent procedure to her head or spine.  Denies chiropractor use.  The history is provided by the patient.  Dizziness Quality:  Head spinning, room spinning and lightheadedness Severity:  Moderate Onset quality:  Gradual Duration:  12 months Timing:  Intermittent Progression:  Unchanged Chronicity:  New Relieved by:  Nothing Worsened by:  Nothing Ineffective treatments:  None tried Associated symptoms: headaches   Associated symptoms: no chest pain, no nausea, no palpitations, no shortness of breath and no vomiting   Headache Associated symptoms: dizziness   Associated symptoms: no congestion, no fever, no myalgias, no nausea and no vomiting        Past Medical History:  Diagnosis Date  . Detached retina, left     Patient Active Problem List   Diagnosis Date Noted  . Obesity 09/19/2016  . Cough 09/19/2016  . Headache 03/22/2016  . Nicotine dependence 03/22/2016    Past Surgical History:  Procedure Laterality Date  . EYE SURGERY Left    detached visiion     OB History   No obstetric history on file.     Family History  Adopted: Yes    Problem Relation Age of Onset  . Hypertension Mother   . Osteoarthritis Mother   . Hypertension Other   . Diabetes Other     Social History   Tobacco Use  . Smoking status: Current Some Day Smoker    Types: Cigarettes  . Smokeless tobacco: Never Used  Substance Use Topics  . Alcohol use: Yes    Comment: occasionally   . Drug use: No    Home Medications Prior to Admission medications   Medication Sig Start Date End Date Taking? Authorizing Provider  aspirin-acetaminophen-caffeine (EXCEDRIN MIGRAINE) [phone] MG tablet Take 1 tablet by mouth every 6 (six) hours as needed for headache or migraine.   Yes [provider]  Multiple Vitamin (MULTIVITAMIN WITH MINERALS) TABS tablet Take 1 tablet by mouth daily.   Yes [provider]  meclizine (ANTIVERT) 50 MG tablet Take 1 tablet (50 mg total) by mouth 3 (three) times daily as needed. 09/30/19   Melene Plan, DO  albuterol (PROVENTIL HFA;VENTOLIN HFA) 108 (90 Base) MCG/ACT inhaler Inhale 2 puffs into the lungs every 6 (six) hours as needed for wheezing or shortness of breath. Patient not taking: Reported on 09/07/2019 05/12/17 09/07/19  Olive Bass, FNP    Allergies    Patient has no known allergies.  Review of Systems   Review of Systems  Constitutional: Negative for chills and fever.  HENT: Negative for congestion and  rhinorrhea.   Eyes: Negative for redness and visual disturbance.  Respiratory: Negative for shortness of breath and wheezing.   Cardiovascular: Negative for chest pain and palpitations.  Gastrointestinal: Negative for nausea and vomiting.  Genitourinary: Negative for dysuria and urgency.  Musculoskeletal: Negative for arthralgias and myalgias.  Skin: Negative for pallor and wound.  Neurological: Positive for dizziness and headaches.    Physical Exam Updated Vital Signs BP (!) 141/106 (BP Location: Left Arm)   Pulse 92   Temp 98.4 F (36.9 C) (Oral)   Resp 18   Ht 5\' 6"  (1.676  m)   Wt 117.9 kg   LMP 09/20/2019 (Approximate)   SpO2 100%   BMI 41.96 kg/m   Physical Exam Vitals and nursing note reviewed.  Constitutional:      General: She is not in acute distress.    Appearance: She is well-developed. She is not diaphoretic.  HENT:     Head: Normocephalic and atraumatic.     Right Ear: Tympanic membrane normal.     Left Ear: Tympanic membrane normal.  Cardiovascular:     Rate and Rhythm: Normal rate and regular rhythm.     Heart sounds: No murmur. No friction rub. No gallop.   Pulmonary:     Effort: Pulmonary effort is normal.     Breath sounds: No wheezing or rales.  Abdominal:     General: There is no distension.     Palpations: Abdomen is soft.     Tenderness: There is no abdominal tenderness.  Musculoskeletal:        General: No tenderness.     Cervical back: Normal range of motion and neck supple.  Skin:    General: Skin is warm and dry.  Neurological:     Mental Status: She is alert and oriented to person, place, and time.     GCS: GCS eye subscore is 4. GCS verbal subscore is 5. GCS motor subscore is 6.     Cranial Nerves: Cranial nerves are intact.     Sensory: Sensation is intact.     Motor: Motor function is intact.     Coordination: Coordination is intact.     Gait: Gait is intact.     Comments: Left pupil is misshapen and nonreactive.  Somewhat disconjugate gaze  Psychiatric:        Behavior: Behavior normal.     ED Results / Procedures / Treatments   Labs (all labs ordered are listed, but only abnormal results are displayed) Labs Reviewed  CBC - Abnormal; Notable for the following components:      Result Value   WBC 12.2 (*)    RDW 17.4 (*)    All other components within normal limits  BASIC METABOLIC PANEL  HCG, QUANTITATIVE, PREGNANCY    EKG None  Radiology No results found.  Procedures Procedures (including critical care time)  Medications Ordered in ED Medications - No data to display  ED Course  I have  reviewed the triage vital signs and the nursing notes.  Pertinent labs & imaging results that were available during my care of the patient were reviewed by me and considered in my medical decision making (see chart for details).    MDM Rules/Calculators/A&P                      44 yo F with a chief complaint of a headache.  This lasted less than a minute and resolved on its own.  Happened when  she stood up and coincided with a dizzy spell that she typically gets.  This completely resolved and has been resolved for greater than 7 hours.  She is benign neurologic exam.  Seems unlikely to be subarachnoid hemorrhage.  While the patient follow-up with her family doctor in the office.  She has also been having dizziness upon standing for at least the past year.  Could be orthostasis, seems less likely to be BPPV for such a prolonged period of time.  We will do a prescription for meclizine.  PCP follow-up.  10:15 PM:  I have discussed the diagnosis/risks/treatment options with the patient and believe the pt to be eligible for discharge home to follow-up with PCP. We also discussed returning to the ED immediately if new or worsening sx occur. We discussed the sx which are most concerning (e.g., recurrent and persistent headache. Stroke s/sx) that necessitate immediate return. Medications administered to the patient during their visit and any new prescriptions provided to the patient are listed below.  Medications given during this visit Medications - No data to display   The patient appears reasonably screen and/or stabilized for discharge and I doubt any other medical condition or other [facility] requiring further screening, evaluation, or treatment in the ED at this time prior to discharge.   Final Clinical Impression(s) / ED Diagnoses Final diagnoses:  Bad headache  Dizziness    Rx / DC Orders ED Discharge Orders         Ordered    meclizine (ANTIVERT) 50 MG tablet  3 times daily PRN     09/30/19  2209           Melene Plan, DO 09/30/19 2215

## 2019-09-30 NOTE — Discharge Instructions (Signed)
Take 4 over the counter ibuprofen tablets 3 times a day or 2 over-the-counter naproxen tablets twice a day for pain. Also take tylenol 1000mg(2 extra strength) four times a day.    

## 2019-09-30 NOTE — ED Triage Notes (Signed)
Patient states when she woke this AM and got out of bed she had dizziness and a sharp pain in her temple and and behind both eyes. Patient states she has been having intermittent dizziness x 2 months.

## 2020-03-19 ENCOUNTER — Emergency Department (HOSPITAL_COMMUNITY)
Admission: EM | Admit: 2020-03-19 | Discharge: 2020-03-20 | Disposition: A | Discharge status: Home / Self Care | Payer: 59 | Attending: Emergency Medicine | Admitting: Emergency Medicine

## 2020-03-19 ENCOUNTER — Encounter (HOSPITAL_COMMUNITY): Payer: Self-pay

## 2020-03-19 ENCOUNTER — Other Ambulatory Visit: Payer: Self-pay

## 2020-03-19 DIAGNOSIS — N611 Abscess of the breast and nipple: Secondary | ICD-10-CM | POA: Diagnosis not present

## 2020-03-19 DIAGNOSIS — N6320 Unspecified lump in the left breast, unspecified quadrant: Secondary | ICD-10-CM | POA: Diagnosis present

## 2020-03-19 DIAGNOSIS — F17210 Nicotine dependence, cigarettes, uncomplicated: Secondary | ICD-10-CM | POA: Diagnosis not present

## 2020-03-19 NOTE — ED Triage Notes (Signed)
Pt reports a lump under left breast for a few days. Lump approx the size of a dime.

## 2020-03-19 NOTE — ED Provider Notes (Signed)
[facility]-EMERGENCY DEPT Provider Note   CSN: [id number] Arrival date & time: 03/19/20  2254     History No chief complaint on file.   Cheyenne [name] is a 44 y.o. female with history of obesity and left detached retina who presents the emergency department with a chief complaint of left breast lump.  The patient reports that she developed a lump, that is approximately the size of a dime, under her left breast several days ago.  Pain has been constant and gradually worsening.  No known aggravating or alleviating factors.  She rates the pain as 4 out of 10.  She reports that she has been using Boil Ease on the area with slight improvement in the size of the lump.  She does report that she has a history of boils to the groin and thighs, but no history of abscesses on the breast.  She denies fever, chills, drainage from the area, red streaking, chest pain, shortness of breath, or nipple discharge.  No other treatment prior to arrival.  The history is provided by the patient and medical records. No language interpreter was used.       Past Medical History:  Diagnosis Date   Detached retina, left     Patient Active Problem List   Diagnosis Date Noted   Obesity 09/19/2016   Cough 09/19/2016   Headache 03/22/2016   Nicotine dependence 03/22/2016    Past Surgical History:  Procedure Laterality Date   EYE SURGERY Left    detached visiion     OB History   No obstetric history on file.     Family History  Adopted: Yes  Problem Relation Age of Onset   Hypertension Mother    Osteoarthritis Mother    Hypertension Other    Diabetes Other     Social History   Tobacco Use   Smoking status: Current Some Day Smoker    Types: Cigarettes   Smokeless tobacco: Never Used  Vaping Use   Vaping Use: Never used  Substance Use Topics   Alcohol use: Yes    Comment: occasionally    Drug use: No    Home Medications Prior to Admission  medications   Medication Sig Start Date End Date Taking? Authorizing Provider  aspirin-acetaminophen-caffeine (EXCEDRIN MIGRAINE) [phone] MG tablet Take 1 tablet by mouth every 6 (six) hours as needed for headache or migraine.    [provider]  cephALEXin (KEFLEX) 500 MG capsule Take 1 capsule (500 mg total) by mouth 4 (four) times daily for 5 days. 03/20/20 03/25/20  Delos Klich A, PA-C  meclizine (ANTIVERT) 50 MG tablet Take 1 tablet (50 mg total) by mouth 3 (three) times daily as needed. 09/30/19   Melene Plan, DO  Multiple Vitamin (MULTIVITAMIN WITH MINERALS) TABS tablet Take 1 tablet by mouth daily.    [provider]  albuterol (PROVENTIL HFA;VENTOLIN HFA) 108 (90 Base) MCG/ACT inhaler Inhale 2 puffs into the lungs every 6 (six) hours as needed for wheezing or shortness of breath. Patient not taking: Reported on 09/07/2019 05/12/17 09/07/19  Olive Bass, FNP    Allergies    Patient has no known allergies.  Review of Systems   Review of Systems  Constitutional: Negative for activity change, chills and fever.  Respiratory: Negative for shortness of breath.   Cardiovascular: Negative for chest pain.       Breast pain  Gastrointestinal: Negative for abdominal pain, diarrhea, nausea and vomiting.  Musculoskeletal: Negative for back pain  and myalgias.  Skin: Positive for wound. Negative for color change and rash.  Neurological: Negative for weakness.    Physical Exam Updated Vital Signs BP (!) 148/103 (BP Location: Right Arm)    Pulse (!) 115    Temp 98.2 F (36.8 C) (Oral)    Resp 17    Ht 5\' 6"  (1.676 m)    Wt 104.3 kg    SpO2 98%    BMI 37.12 kg/m   Physical Exam Vitals and nursing note reviewed.  Constitutional:      General: She is not in acute distress. HENT:     Head: Normocephalic.  Eyes:     Conjunctiva/sclera: Conjunctivae normal.  Cardiovascular:     Rate and Rhythm: Normal rate and regular rhythm.     Heart sounds: No murmur heard.    No friction rub. No gallop.   Pulmonary:     Effort: Pulmonary effort is normal. No respiratory distress.  Chest:       Comments: Chaperoned exam.  There is a 1 cm area of fluctuance noted to the inferior left breast at the 6 o'clock position.  There is no active drainage or drainage able to be expressed.  No induration, erythema, warmth, or red streaking. Abdominal:     General: There is no distension.     Palpations: Abdomen is soft.  Musculoskeletal:     Cervical back: Neck supple.  Skin:    General: Skin is warm.     Findings: No rash.  Neurological:     Mental Status: She is alert.  Psychiatric:        Behavior: Behavior normal.     ED Results / Procedures / Treatments   Labs (all labs ordered are listed, but only abnormal results are displayed) Labs Reviewed - No data to display  EKG None  Radiology No results found.  Procedures Procedures (including critical care time)  Medications Ordered in ED Medications - No data to display  ED Course  I have reviewed the triage vital signs and the nursing notes.  Pertinent labs & imaging results that were available during my care of the patient were reviewed by me and considered in my medical decision making (see chart for details).    MDM Rules/Calculators/A&P                          44 year old female with history of obesity and left detached retina who presents to the emergency department with a left breast abscess that is approximately 1 cm.  No active drainage or drainage able to be expressed.  She has had no constitutional symptoms.  Shared decision-making conversation with the patient.  She declines I&D at this time and would prefer conservative management with warm compresses.  I think that this is reasonable.  She was counseled on reducing friction to the area as this may make symptoms worse.  I think it is reasonable to also give her a short course of antibiotics that she may initiate her home if symptoms do  not significantly improve with warm compresses over the next few days.  She is agreeable with this plan at this time.  I do not see any evidence of mastitis, cellulitis.  All questions answered.  She is hemodynamically stable in no acute distress.  Safe for discharge home with outpatient follow-up as needed.  Final Clinical Impression(s) / ED Diagnoses Final diagnoses:  Abscess of left breast    Rx / DC  Orders ED Discharge Orders         Ordered    cephALEXin (KEFLEX) 500 MG capsule  4 times daily        03/20/20 0008           Tanyia Grabbe A, PA-C 03/20/20 0018    Molpus, Jonny Ruiz, MD 03/20/20 [phone]

## 2020-03-20 MED ORDER — CEPHALEXIN 500 MG PO CAPS: 500.0000 mg | ORAL_CAPSULE | Freq: Four times a day (QID) | ORAL | 0 refills | Status: AC

## 2020-03-20 NOTE — Discharge Instructions (Signed)
Thank you for allowing me to care for you today in the Emergency Department.   You have an abscess under your left breast.  To treat this at home, you can take a washcloth and run it under warm water and hold it over the area to create a warm compress.  Perform this at least 3-4 times a day for at least the next 5 days.  This will help to bring the infection to the surface and facilitate draining.  You can take Tylenol Motrin for pain.  We will give you some gauze that you can also place over the area so that you do not get drainage underclothes.  This will also help to decrease the friction of the area to cause worsening symptoms.  If your symptoms do not improve with this method in the next couple of days, you can start taking 1 tablet of Keflex by mouth every 6 hours for 5 days.  If you do not have a significant improvement in 1 week, you should follow-up for a recheck.  Patient return to the emergency department if you start having fevers, if you develop severe redness and swelling to the entire breast, drainage from the nipple, upper abdominal pain, red streaking from the wound, or other new, concerning symptoms.

## 2020-03-20 NOTE — ED Notes (Signed)
Discharged with no concerns  

## 2020-05-07 ENCOUNTER — Encounter (HOSPITAL_COMMUNITY): Payer: Self-pay

## 2020-05-07 ENCOUNTER — Other Ambulatory Visit: Payer: Self-pay

## 2020-05-07 ENCOUNTER — Emergency Department (HOSPITAL_COMMUNITY)
Admission: EM | Admit: 2020-05-07 | Discharge: 2020-05-07 | Disposition: A | Discharge status: Home / Self Care | Payer: 59 | Attending: Emergency Medicine | Admitting: Emergency Medicine

## 2020-05-07 DIAGNOSIS — F17210 Nicotine dependence, cigarettes, uncomplicated: Secondary | ICD-10-CM | POA: Diagnosis not present

## 2020-05-07 DIAGNOSIS — Z7982 Long term (current) use of aspirin: Secondary | ICD-10-CM | POA: Insufficient documentation

## 2020-05-07 DIAGNOSIS — L720 Epidermal cyst: Secondary | ICD-10-CM | POA: Insufficient documentation

## 2020-05-07 DIAGNOSIS — L089 Local infection of the skin and subcutaneous tissue, unspecified: Secondary | ICD-10-CM

## 2020-05-07 DIAGNOSIS — L02414 Cutaneous abscess of left upper limb: Secondary | ICD-10-CM | POA: Diagnosis present

## 2020-05-07 MED ORDER — SULFAMETHOXAZOLE-TRIMETHOPRIM 800-160 MG PO TABS: 1.0000 | ORAL_TABLET | Freq: Two times a day (BID) | ORAL | 0 refills | Status: AC

## 2020-05-07 MED ORDER — LIDOCAINE-EPINEPHRINE 2 %-1:100000 IJ SOLN
20.0000 mL | Freq: Once | INTRAMUSCULAR | Status: AC
  Administered 2020-05-07: 20 mL
  Filled 2020-05-07: qty 1

## 2020-05-07 MED ORDER — IBUPROFEN 600 MG PO TABS: 600.0000 mg | ORAL_TABLET | Freq: Four times a day (QID) | ORAL | 0 refills | Status: DC | PRN

## 2020-05-07 NOTE — Discharge Instructions (Signed)
You have had an infected epidermal cyst that was incised and drained in the ED. Please have your packing removed in 2 days. Take antibiotic as prescribed. Return if you have any concern.

## 2020-05-07 NOTE — ED Triage Notes (Addendum)
Pt presents with c/o possible abscess to the top of her left shoulder/upper back area for the last 2 days.

## 2020-05-07 NOTE — ED Provider Notes (Signed)
[facility]-EMERGENCY DEPT Provider Note   CSN: [id number] Arrival date & time: 05/07/20  1946     History Chief Complaint  Patient presents with  . Abscess    Cheyenne [name] is a 44 y.o. female.  The history is provided by the patient. No language interpreter was used.  Abscess Associated symptoms: no fever and no headaches      44 year old female presented with concerns for potential skin infection.  Patient report noticing a bump to her left upper shoulder 2 days ago that has since increased in size become very painful to the touch and may have some fluid.  Pain is sharp throbbing moderate in severity nonradiating.  No associated fever no chest pain no trouble breathing.  She is up-to-date with tetanus.  She has had boils to her thighs in the past.  She has been using Boil-EZ cream without adequate relief.  She did not recall any specific injury.  Past Medical History:  Diagnosis Date  . Detached retina, left     Patient Active Problem List   Diagnosis Date Noted  . Obesity 09/19/2016  . Cough 09/19/2016  . Headache 03/22/2016  . Nicotine dependence 03/22/2016    Past Surgical History:  Procedure Laterality Date  . EYE SURGERY Left    detached visiion     OB History   No obstetric history on file.     Family History  Adopted: Yes  Problem Relation Age of Onset  . Hypertension Mother   . Osteoarthritis Mother   . Hypertension Other   . Diabetes Other     Social History   Tobacco Use  . Smoking status: Current Some Day Smoker    Types: Cigarettes  . Smokeless tobacco: Never Used  Vaping Use  . Vaping Use: Never used  Substance Use Topics  . Alcohol use: Yes    Comment: occasionally   . Drug use: No    Home Medications Prior to Admission medications   Medication Sig Start Date End Date Taking? Authorizing Provider  aspirin-acetaminophen-caffeine (EXCEDRIN MIGRAINE) [phone] MG tablet Take 1 tablet by mouth every 6 (six)  hours as needed for headache or migraine.    [provider]  meclizine (ANTIVERT) 50 MG tablet Take 1 tablet (50 mg total) by mouth 3 (three) times daily as needed. 09/30/19   Melene Plan, DO  Multiple Vitamin (MULTIVITAMIN WITH MINERALS) TABS tablet Take 1 tablet by mouth daily.    [provider]  albuterol (PROVENTIL HFA;VENTOLIN HFA) 108 (90 Base) MCG/ACT inhaler Inhale 2 puffs into the lungs every 6 (six) hours as needed for wheezing or shortness of breath. Patient not taking: Reported on 09/07/2019 05/12/17 09/07/19  Olive Bass, FNP    Allergies    Patient has no known allergies.  Review of Systems   Review of Systems  Constitutional: Negative for fever.  Skin: Positive for rash.  Neurological: Negative for numbness and headaches.    Physical Exam Updated Vital Signs BP (!) 150/99 (BP Location: Left Arm)   Pulse 98   Temp 98.5 F (36.9 C) (Oral)   Resp 20   LMP 04/18/2020 (Approximate)   SpO2 100%   Physical Exam Vitals and nursing note reviewed.  Constitutional:      General: She is not in acute distress.    Appearance: She is well-developed and well-nourished. She is obese.  HENT:     Head: Atraumatic.  Eyes:     Conjunctiva/sclera: Conjunctivae normal.  Musculoskeletal:  Cervical back: Neck supple.  Skin:    Findings: No rash.     Comments: Left shoulder: There is an area of induration fluctuant and erythema noted to the superior aspect of the trapezius that is tender to palpation.  Area measuring approximately 5 cm in diameter.  Neurological:     Mental Status: She is alert.  Psychiatric:        Mood and Affect: Mood and affect normal.     ED Results / Procedures / Treatments   Labs (all labs ordered are listed, but only abnormal results are displayed) Labs Reviewed - No data to display  EKG None  Radiology No results found.  Procedures .Marland KitchenIncision and Drainage  Date/Time: 05/07/2020 10:47 PM Performed by: Fayrene Helper, PA-C Authorized by: Fayrene Helper, PA-C   Consent:    Consent obtained:  Verbal   Consent given by:  Patient   Risks discussed:  Bleeding, incomplete drainage, pain and damage to other organs   Alternatives discussed:  No treatment Universal protocol:    Procedure explained and questions answered to patient or proxy's satisfaction: yes     Relevant documents present and verified: yes     Test results available : yes     Imaging studies available: yes     Required blood products, implants, devices, and special equipment available: yes     Site/side marked: yes     Immediately prior to procedure, a time out was called: yes     Patient identity confirmed:  Verbally with patient Location:    Type:  Abscess   Size:  5cm   Location: left trapezius/shoulder. Pre-procedure details:    Skin preparation:  Betadine Anesthesia:    Anesthesia method:  Local infiltration   Local anesthetic:  Lidocaine 1% WITH epi Procedure type:    Complexity:  Complex Procedure details:    Incision types:  Single straight   Incision depth:  Subcutaneous   Scalpel blade:  11   Wound management:  Probed and deloculated, irrigated with saline and extensive cleaning   Drainage:  Purulent   Drainage amount:  Moderate   Wound treatment:  Wound left open   Packing materials:  1/4 in gauze Post-procedure details:    Procedure completion:  Tolerated well, no immediate complications   (including critical care time)  Medications Ordered in ED Medications  lidocaine-EPINEPHrine (XYLOCAINE W/EPI) 2 %-1:100000 (with pres) injection 20 mL (20 mLs Infiltration Given 05/07/20 2230)    ED Course  I have reviewed the triage vital signs and the nursing notes.  Pertinent labs & imaging results that were available during my care of the patient were reviewed by me and considered in my medical decision making (see chart for details).    MDM Rules/Calculators/A&P                          BP (!) 150/99 (BP  Location: Left Arm)   Pulse 98   Temp 98.5 F (36.9 C) (Oral)   Resp 20   LMP 04/18/2020 (Approximate)   SpO2 100%   Final Clinical Impression(s) / ED Diagnoses Final diagnoses:  Infected epidermoid cyst    Rx / DC Orders ED Discharge Orders         Ordered    sulfamethoxazole-trimethoprim (BACTRIM DS) 800-160 MG tablet  2 times daily        05/07/20 2314    ibuprofen (ADVIL) 600 MG tablet  Every 6 hours PRN  05/07/20 2314         10:50 PM Patient with an infected epidermoid cyst to her left upper shoulder and trapezius region.  Successful incision and drainage with an attempt of removal of the core.  Packing was placed.  Patient discharged home with antibiotic as well as nonopioid medication.  She will need to have her packing removed in 2 days. She report hx of tubal ligation and sts she's not pregnant. Return precaution discussed.  She is up-to-date with tetanus   Fayrene Helper, PA-C 05/07/20 2316    Mancel Bale, MD 05/08/20 [phone]

## 2020-05-09 ENCOUNTER — Other Ambulatory Visit: Payer: Self-pay

## 2020-05-09 ENCOUNTER — Emergency Department (HOSPITAL_COMMUNITY)
Admission: EM | Admit: 2020-05-09 | Discharge: 2020-05-09 | Disposition: A | Discharge status: Home / Self Care | Payer: 59 | Attending: Emergency Medicine | Admitting: Emergency Medicine

## 2020-05-09 ENCOUNTER — Encounter (HOSPITAL_COMMUNITY): Payer: Self-pay

## 2020-05-09 DIAGNOSIS — L02414 Cutaneous abscess of left upper limb: Secondary | ICD-10-CM | POA: Insufficient documentation

## 2020-05-09 DIAGNOSIS — Z48817 Encounter for surgical aftercare following surgery on the skin and subcutaneous tissue: Secondary | ICD-10-CM | POA: Insufficient documentation

## 2020-05-09 DIAGNOSIS — Z5189 Encounter for other specified aftercare: Secondary | ICD-10-CM

## 2020-05-09 DIAGNOSIS — F17210 Nicotine dependence, cigarettes, uncomplicated: Secondary | ICD-10-CM | POA: Diagnosis not present

## 2020-05-09 DIAGNOSIS — Z7982 Long term (current) use of aspirin: Secondary | ICD-10-CM | POA: Insufficient documentation

## 2020-05-09 NOTE — Discharge Instructions (Addendum)
You should perform warm soaks at least 4 times a day.  Take the antibiotic as prescribed.  You will likely continue to have pus draining from the wound.  It is a good sign as long as it is a small amount.  If you start to have excessive drainage from the wound, worsening redness around the wound or fever and chills you should return to the emergency department for further evaluation.

## 2020-05-09 NOTE — ED Triage Notes (Signed)
Pt states she is here to have her wound checked and to possibly have packing removed from an abscess I&D 2 days ago.

## 2020-05-09 NOTE — ED Provider Notes (Signed)
[facility]   CSN: [id number] Arrival date & time: 05/09/20  1047     History Chief Complaint  Patient presents with  . Wound Check    Cheyenne [name] is a 44 y.o. female with noncontributory past medical history.  HPI Patient presents to emergency department today with chief complaint of wound check.  Patient was seen in the ED x2 days ago for an abscess on left shoulder.  Incision and drainage was performed and packing was placed.  She was discharged home on Bactrim.  She picked up the antibiotic today, has not started taking it yet.  She states she had some purulent drainage from the wound yesterday.  She states the pain has significantly improved since having it drained.  Packing still in place.  She denies any fever, chills, worsening erythema, or red streaking.    Past Medical History:  Diagnosis Date  . Detached retina, left     Patient Active Problem List   Diagnosis Date Noted  . Obesity 09/19/2016  . Cough 09/19/2016  . Headache 03/22/2016  . Nicotine dependence 03/22/2016    Past Surgical History:  Procedure Laterality Date  . EYE SURGERY Left    detached visiion     OB History   No obstetric history on file.     Family History  Adopted: Yes  Problem Relation Age of Onset  . Hypertension Mother   . Osteoarthritis Mother   . Hypertension Other   . Diabetes Other     Social History   Tobacco Use  . Smoking status: Current Some Day Smoker    Types: Cigarettes  . Smokeless tobacco: Never Used  Vaping Use  . Vaping Use: Never used  Substance Use Topics  . Alcohol use: Yes    Comment: occasionally   . Drug use: No    Home Medications Prior to Admission medications   Medication Sig Start Date End Date Taking? Authorizing Provider  aspirin-acetaminophen-caffeine (EXCEDRIN MIGRAINE) [phone] MG tablet Take 1 tablet by mouth every 6 (six) hours as needed for headache or migraine.    [provider]  ibuprofen (ADVIL) 600 MG tablet Take 1 tablet (600 mg total) by mouth every 6 (six) hours as needed. 05/07/20   Fayrene Helper, PA-C  meclizine (ANTIVERT) 50 MG tablet Take 1 tablet (50 mg total) by mouth 3 (three) times daily as needed. 09/30/19   Melene Plan, DO  Multiple Vitamin (MULTIVITAMIN WITH MINERALS) TABS tablet Take 1 tablet by mouth daily.    [provider]  sulfamethoxazole-trimethoprim (BACTRIM DS) 800-160 MG tablet Take 1 tablet by mouth 2 (two) times daily for 7 days. 05/07/20 05/14/20  Fayrene Helper, PA-C  albuterol (PROVENTIL HFA;VENTOLIN HFA) 108 (90 Base) MCG/ACT inhaler Inhale 2 puffs into the lungs every 6 (six) hours as needed for wheezing or shortness of breath. Patient not taking: Reported on 09/07/2019 05/12/17 09/07/19  Olive Bass, FNP    Allergies    Patient has no known allergies.  Review of Systems   Review of Systems All other systems are reviewed and are negative for acute change except as noted in the HPI.  Physical Exam Updated Vital Signs BP (!) 142/101 (BP Location: Right Arm)   Pulse 95   Temp 98.1 F (36.7 C) (Oral)   Resp 17   LMP 04/18/2020 (Approximate)   SpO2 100%   Physical Exam Vitals and nursing note reviewed.  Constitutional:      Appearance: She is  well-developed. She is not ill-appearing or toxic-appearing.  HENT:     Head: Normocephalic and atraumatic.     Nose: Nose normal.  Eyes:     General: No scleral icterus.       Right eye: No discharge.        Left eye: No discharge.     Conjunctiva/sclera: Conjunctivae normal.  Neck:     Vascular: No JVD.  Cardiovascular:     Rate and Rhythm: Normal rate and regular rhythm.     Pulses: Normal pulses.     Heart sounds: Normal heart sounds.  Pulmonary:     Effort: Pulmonary effort is normal.     Breath sounds: Normal breath sounds.  Abdominal:     General: There is no distension.  Musculoskeletal:        General: Normal range of motion.        Arms:     Cervical back: Normal range of motion.  Skin:    General: Skin is warm and dry.  Neurological:     Mental Status: She is oriented to person, place, and time.     GCS: GCS eye subscore is 4. GCS verbal subscore is 5. GCS motor subscore is 6.     Comments: Fluent speech, no facial droop.  Psychiatric:        Behavior: Behavior normal.     ED Results / Procedures / Treatments   Labs (all labs ordered are listed, but only abnormal results are displayed) Labs Reviewed - No data to display  EKG None  Radiology No results found.  Procedures Procedures (including critical care time)  Medications Ordered in ED Medications - No data to display  ED Course  I have reviewed the triage vital signs and the nursing notes.  Pertinent labs & imaging results that were available during my care of the patient were reviewed by me and considered in my medical decision making (see chart for details).    MDM Rules/Calculators/A&P                          History provided by patient with additional history obtained from chart review.    Pt with abscess seen two days ago with I&D. Chart reviewed in EPIC. Pt dip antibiotics in the pharmacy today, has not yet started taking it.  The site is healthy appearing, with only a small amount of purulent drainage around the skin but incision is open and without significant drainage. No surrounding cellulitis and no palpable fluctuance or induration indicating deeper infection or residual abscess.  Packing was removed.  Pt is also without any pain or tenderness in the area. Discussed home care and return precautions, pt advised to continue all antibiotics until they are gone.    Portions of this note were generated with Scientist, clinical (histocompatibility and immunogenetics). Dictation errors may occur despite best attempts at proofreading.   Final Clinical Impression(s) / ED Diagnoses Final diagnoses:  Wound check, abscess    Rx / DC Orders ED Discharge Orders    None        Kandice Hams 05/09/20 1154    Cathren Laine, MD 05/09/20 1351

## 2021-11-02 ENCOUNTER — Other Ambulatory Visit: Payer: Self-pay | Admitting: Orthopedic Surgery

## 2021-11-02 DIAGNOSIS — S83001A Unspecified subluxation of right patella, initial encounter: Secondary | ICD-10-CM

## 2021-11-11 ENCOUNTER — Ambulatory Visit
Admission: RE | Admit: 2021-11-11 | Discharge: 2021-11-11 | Disposition: A | Discharge status: Home / Self Care | Payer: 59 | Source: Ambulatory Visit | Attending: Orthopedic Surgery | Admitting: Orthopedic Surgery

## 2021-11-11 DIAGNOSIS — S83001A Unspecified subluxation of right patella, initial encounter: Secondary | ICD-10-CM

## 2022-10-22 DIAGNOSIS — I5032 Chronic diastolic (congestive) heart failure: Secondary | ICD-10-CM

## 2022-10-22 HISTORY — DX: Chronic diastolic (congestive) heart failure: I50.32

## 2022-11-14 ENCOUNTER — Observation Stay (HOSPITAL_COMMUNITY)
Admission: EM | Admit: 2022-11-14 | Discharge: 2022-11-15 | Disposition: A | Discharge status: Home / Self Care | Payer: 59 | Attending: Internal Medicine | Admitting: Internal Medicine

## 2022-11-14 ENCOUNTER — Emergency Department (HOSPITAL_COMMUNITY): Payer: 59

## 2022-11-14 ENCOUNTER — Other Ambulatory Visit: Payer: Self-pay

## 2022-11-14 ENCOUNTER — Encounter (HOSPITAL_COMMUNITY): Payer: Self-pay

## 2022-11-14 ENCOUNTER — Emergency Department (HOSPITAL_BASED_OUTPATIENT_CLINIC_OR_DEPARTMENT_OTHER): Payer: 59

## 2022-11-14 DIAGNOSIS — R0602 Shortness of breath: Secondary | ICD-10-CM | POA: Diagnosis present

## 2022-11-14 DIAGNOSIS — I5033 Acute on chronic diastolic (congestive) heart failure: Secondary | ICD-10-CM | POA: Diagnosis not present

## 2022-11-14 DIAGNOSIS — E785 Hyperlipidemia, unspecified: Secondary | ICD-10-CM

## 2022-11-14 DIAGNOSIS — F17200 Nicotine dependence, unspecified, uncomplicated: Secondary | ICD-10-CM | POA: Diagnosis present

## 2022-11-14 DIAGNOSIS — R770 Abnormality of albumin: Secondary | ICD-10-CM | POA: Diagnosis not present

## 2022-11-14 DIAGNOSIS — R0789 Other chest pain: Secondary | ICD-10-CM | POA: Diagnosis not present

## 2022-11-14 DIAGNOSIS — Z6837 Body mass index (BMI) 37.0-37.9, adult: Secondary | ICD-10-CM | POA: Diagnosis not present

## 2022-11-14 DIAGNOSIS — D509 Iron deficiency anemia, unspecified: Secondary | ICD-10-CM | POA: Diagnosis present

## 2022-11-14 DIAGNOSIS — F17210 Nicotine dependence, cigarettes, uncomplicated: Secondary | ICD-10-CM | POA: Insufficient documentation

## 2022-11-14 DIAGNOSIS — E669 Obesity, unspecified: Secondary | ICD-10-CM | POA: Diagnosis present

## 2022-11-14 DIAGNOSIS — I110 Hypertensive heart disease with heart failure: Secondary | ICD-10-CM | POA: Diagnosis not present

## 2022-11-14 DIAGNOSIS — J90 Pleural effusion, not elsewhere classified: Secondary | ICD-10-CM | POA: Diagnosis not present

## 2022-11-14 DIAGNOSIS — R079 Chest pain, unspecified: Secondary | ICD-10-CM | POA: Diagnosis not present

## 2022-11-14 DIAGNOSIS — Z79899 Other long term (current) drug therapy: Secondary | ICD-10-CM | POA: Insufficient documentation

## 2022-11-14 DIAGNOSIS — I10 Essential (primary) hypertension: Secondary | ICD-10-CM | POA: Diagnosis not present

## 2022-11-14 LAB — ECHOCARDIOGRAM COMPLETE
AR max vel: 3.28 cm2
AV Area VTI: 3.07 cm2
AV Area mean vel: 3.14 cm2
AV Mean grad: 5 mmHg
AV Peak grad: 10.1 mmHg
Ao pk vel: 1.59 m/s
Area-P 1/2: 4.41 cm2
Height: 66 in
S' Lateral: 3.7 cm
Weight: 3680 oz

## 2022-11-14 LAB — CBC WITH DIFFERENTIAL/PLATELET
Abs Immature Granulocytes: 0.03 10*3/uL (ref 0.00–0.07)
Basophils Absolute: 0 10*3/uL (ref 0.0–0.1)
Basophils Relative: 0 %
Eosinophils Absolute: 0.3 10*3/uL (ref 0.0–0.5)
Eosinophils Relative: 3 %
HCT: 30 % — ABNORMAL LOW (ref 36.0–46.0)
Hemoglobin: 9.2 g/dL — ABNORMAL LOW (ref 12.0–15.0)
Immature Granulocytes: 0 %
Lymphocytes Relative: 29 %
Lymphs Abs: 2.4 10*3/uL (ref 0.7–4.0)
MCH: 22.3 pg — ABNORMAL LOW (ref 26.0–34.0)
MCHC: 30.7 g/dL (ref 30.0–36.0)
MCV: 72.8 fL — ABNORMAL LOW (ref 80.0–100.0)
Monocytes Absolute: 0.8 10*3/uL (ref 0.1–1.0)
Monocytes Relative: 10 %
Neutro Abs: 4.8 10*3/uL (ref 1.7–7.7)
Neutrophils Relative %: 58 %
Platelets: 399 10*3/uL (ref 150–400)
RBC: 4.12 MIL/uL (ref 3.87–5.11)
RDW: 16.7 % — ABNORMAL HIGH (ref 11.5–15.5)
WBC: 8.4 10*3/uL (ref 4.0–10.5)
nRBC: 0 % (ref 0.0–0.2)

## 2022-11-14 LAB — HCG, QUANTITATIVE, PREGNANCY: hCG, Beta Chain, Quant, S: <1 m[IU]/mL (ref <5)

## 2022-11-14 LAB — COMPREHENSIVE METABOLIC PANEL
ALT: 14 U/L (ref 0–44)
AST: 18 U/L (ref 15–41)
Albumin: 3.3 g/dL — ABNORMAL LOW (ref 3.5–5.0)
Alkaline Phosphatase: 54 U/L (ref 38–126)
Anion gap: 12 (ref 5–15)
BUN: 8 mg/dL (ref 6–20)
CO2: 23 mmol/L (ref 22–32)
Calcium: 8.9 mg/dL (ref 8.9–10.3)
Chloride: 102 mmol/L (ref 98–111)
Creatinine, Ser: 0.86 mg/dL (ref 0.44–1.00)
GFR, Estimated: >60 mL/min (ref >60)
Glucose, Bld: 90 mg/dL (ref 70–99)
Potassium: 3.5 mmol/L (ref 3.5–5.1)
Sodium: 137 mmol/L (ref 135–145)
Total Bilirubin: 0.3 mg/dL (ref 0.3–1.2)
Total Protein: 7.3 g/dL (ref 6.5–8.1)

## 2022-11-14 LAB — HIV ANTIBODY (ROUTINE TESTING W REFLEX): HIV Screen 4th Generation wRfx: NONREACTIVE

## 2022-11-14 LAB — TSH: TSH: 0.973 u[IU]/mL (ref 0.350–4.500)

## 2022-11-14 LAB — BRAIN NATRIURETIC PEPTIDE: B Natriuretic Peptide: 158 pg/mL — ABNORMAL HIGH (ref 0.0–100.0)

## 2022-11-14 LAB — RETICULOCYTES
Immature Retic Fract: 25.4 % — ABNORMAL HIGH (ref 2.3–15.9)
RBC.: 4.32 MIL/uL (ref 3.87–5.11)
Retic Count, Absolute: 68.7 10*3/uL (ref 19.0–186.0)
Retic Ct Pct: 1.6 % (ref 0.4–3.1)

## 2022-11-14 LAB — HEMOGLOBIN A1C
Hgb A1c MFr Bld: 5.4 % (ref 4.8–5.6)
Mean Plasma Glucose: 108.28 mg/dL

## 2022-11-14 LAB — IRON AND TIBC
Iron: 23 ug/dL — ABNORMAL LOW (ref 28–170)
Saturation Ratios: 5 % — ABNORMAL LOW (ref 10.4–31.8)
TIBC: 423 ug/dL (ref 250–450)
UIBC: 400 ug/dL

## 2022-11-14 LAB — TROPONIN I (HIGH SENSITIVITY): Troponin I (High Sensitivity): 12 ng/L (ref <18)

## 2022-11-14 LAB — FOLATE: Folate: 8.3 ng/mL (ref >5.9)

## 2022-11-14 LAB — FERRITIN: Ferritin: 9 ng/mL — ABNORMAL LOW (ref 11–307)

## 2022-11-14 LAB — VITAMIN B12: Vitamin B-12: 240 pg/mL (ref 180–914)

## 2022-11-14 MED ORDER — ONDANSETRON HCL 4 MG/2ML IJ SOLN: 4.0000 mg | Freq: Four times a day (QID) | INTRAMUSCULAR | Status: DC | PRN

## 2022-11-14 MED ORDER — NICOTINE 14 MG/24HR TD PT24
14.0000 mg | MEDICATED_PATCH | Freq: Every day | TRANSDERMAL | Status: DC
  Administered 2022-11-14 – 2022-11-15 (×2): 14 mg via TRANSDERMAL
  Filled 2022-11-14 (×2): qty 1

## 2022-11-14 MED ORDER — ACETAMINOPHEN 325 MG PO TABS: 650.0000 mg | ORAL_TABLET | Freq: Four times a day (QID) | ORAL | Status: DC | PRN

## 2022-11-14 MED ORDER — ONDANSETRON HCL 4 MG PO TABS: 4.0000 mg | ORAL_TABLET | Freq: Four times a day (QID) | ORAL | Status: DC | PRN

## 2022-11-14 MED ORDER — FUROSEMIDE 10 MG/ML IJ SOLN
40.0000 mg | Freq: Once | INTRAMUSCULAR | Status: AC
  Administered 2022-11-14: 40 mg via INTRAVENOUS
  Filled 2022-11-14: qty 4

## 2022-11-14 MED ORDER — BISACODYL 5 MG PO TBEC: 5.0000 mg | DELAYED_RELEASE_TABLET | Freq: Every day | ORAL | Status: DC | PRN

## 2022-11-14 MED ORDER — LOSARTAN POTASSIUM 50 MG PO TABS
50.0000 mg | ORAL_TABLET | Freq: Every day | ORAL | Status: DC
  Administered 2022-11-14 – 2022-11-15 (×2): 50 mg via ORAL
  Filled 2022-11-14 (×2): qty 1

## 2022-11-14 MED ORDER — ACETAMINOPHEN 650 MG RE SUPP: 650.0000 mg | Freq: Four times a day (QID) | RECTAL | Status: DC | PRN

## 2022-11-14 MED ORDER — HYDRALAZINE HCL 20 MG/ML IJ SOLN: 5.0000 mg | INTRAMUSCULAR | Status: DC | PRN

## 2022-11-14 MED ORDER — POLYETHYLENE GLYCOL 3350 17 G PO PACK: 17.0000 g | PACK | Freq: Every day | ORAL | Status: DC | PRN

## 2022-11-14 MED ORDER — ENOXAPARIN SODIUM 60 MG/0.6ML IJ SOSY
50.0000 mg | PREFILLED_SYRINGE | INTRAMUSCULAR | Status: DC
  Administered 2022-11-14: 50 mg via SUBCUTANEOUS
  Filled 2022-11-14: qty 0.6

## 2022-11-14 MED ORDER — SODIUM CHLORIDE 0.9% FLUSH
3.0000 mL | Freq: Two times a day (BID) | INTRAVENOUS | Status: DC
  Administered 2022-11-15: 3 mL via INTRAVENOUS

## 2022-11-14 MED ORDER — ALBUTEROL SULFATE HFA 108 (90 BASE) MCG/ACT IN AERS
2.0000 | INHALATION_SPRAY | RESPIRATORY_TRACT | Status: DC | PRN
  Administered 2022-11-14: 2 via RESPIRATORY_TRACT
  Filled 2022-11-14: qty 6.7

## 2022-11-14 MED ORDER — POTASSIUM CHLORIDE CRYS ER 20 MEQ PO TBCR
40.0000 meq | EXTENDED_RELEASE_TABLET | Freq: Once | ORAL | Status: AC
  Administered 2022-11-14: 40 meq via ORAL
  Filled 2022-11-14: qty 2

## 2022-11-14 MED ORDER — TRAZODONE HCL 50 MG PO TABS: 25.0000 mg | ORAL_TABLET | Freq: Every evening | ORAL | Status: DC | PRN

## 2022-11-14 NOTE — ED Triage Notes (Signed)
Complaining of being short of breath tonight. Has been coughing for a couple of weeks now and when she laid down to go to sleep she felt her chest tighten and she could not get a breath. She used her daughters albuterol inhaler and it eased up. Still tight a little now

## 2022-11-14 NOTE — H&P (Signed)
History and Physical    Patient: Cheyenne [name]:[id number] DOB: Feb 09, 1976 DOA: 11/14/2022 DOS: the patient was seen and examined on 11/14/2022 PCP: Patient, No Pcp Per  Patient coming from: Home - lives with children and grandchild; NOK: Mother   Chief Complaint: SOB  HPI: Cheyenne [name] is a 47 y.o. female without known medical history presenting with SOB.  She reports acute onset of SOB with mild chest tightness that came on last night.  She used her daughter's albuterol with some improvement.  She has never been told that she has HTN but has noticed intermittently high BP over the years.  She does not see a PCP - last was in 2018.  She is a smoker and has not tolerated Chantix, and patches and gum "don't work" for her.  She does still have menses, has h/o anemia associated with menses.  Last period was about 10 days ago.    ER Course:  New diastolic CHF on echo today.  New anemia, ?baseline.  Cards recommends diuresis, will consult.     Review of Systems: As mentioned in the history of present illness. All other systems reviewed and are negative. Past Medical History:  Diagnosis Date   Detached retina, left    Past Surgical History:  Procedure Laterality Date   EYE SURGERY Left    detached visiion   Social History:  reports that she has been smoking cigarettes. She has never used smokeless tobacco. She reports current alcohol use. She reports that she does not use drugs.  No Known Allergies  Family History  Adopted: Yes  Problem Relation Age of Onset   Hypertension Mother    Osteoarthritis Mother    Hypertension Other    Diabetes Other     Prior to Admission medications   Medication Sig Start Date End Date Taking? Authorizing Provider  aspirin-acetaminophen-caffeine (EXCEDRIN MIGRAINE) [phone] MG tablet Take 1 tablet by mouth every 6 (six) hours as needed for headache or migraine.    [provider]  ibuprofen (ADVIL) 600 MG tablet Take 1 tablet  (600 mg total) by mouth every 6 (six) hours as needed. 05/07/20   Fayrene Helper, PA-C  meclizine (ANTIVERT) 50 MG tablet Take 1 tablet (50 mg total) by mouth 3 (three) times daily as needed. 09/30/19   Melene Plan, DO  Multiple Vitamin (MULTIVITAMIN WITH MINERALS) TABS tablet Take 1 tablet by mouth daily.    [provider]  albuterol (PROVENTIL HFA;VENTOLIN HFA) 108 (90 Base) MCG/ACT inhaler Inhale 2 puffs into the lungs every 6 (six) hours as needed for wheezing or shortness of breath. Patient not taking: Reported on 09/07/2019 05/12/17 09/07/19  Olive Bass, FNP    Physical Exam: Vitals:   11/14/22 0845 11/14/22 1000 11/14/22 1150 11/14/22 1638  BP: (!) 163/116 (!) 158/103  (!) 156/98  Pulse: 88 92  97  Resp: (!) 25 18  18   Temp:   98 F (36.7 C) 98.4 F (36.9 C)  TempSrc:    Oral  SpO2: 100% 98%  96%  Weight:      Height:       General:  Appears calm and comfortable and is in NAD, on RA Eyes:  R exotropia, normal lids, iris ENT:  grossly normal hearing, lips & tongue, mmm Neck:  no LAD, masses or thyromegaly Cardiovascular:  RRR, no m/r/g. No LE edema.  Respiratory:   CTA bilaterally with no wheezes/rales/rhonchi.  Normal respiratory effort. Abdomen:  soft, NT, ND Skin:  no  rash or induration seen on limited exam Musculoskeletal:  grossly normal tone BUE/BLE, good ROM, no bony abnormality Psychiatric:  grossly normal mood and affect, speech fluent and appropriate, AOx3 Neurologic:  CN 2-12 grossly intact, moves all extremities in coordinated fashion   Radiological Exams on Admission: Independently reviewed - see discussion in A/P where applicable  ECHOCARDIOGRAM COMPLETE  Result Date: 11/14/2022    ECHOCARDIOGRAM REPORT   Patient Name:   Cheyenne [name] Date of Exam: 11/14/2022 Medical Rec #:  [id number]        Height:       66.0 in Accession #:    [id number]       Weight:       230.0 lb Date of Birth:  Jul 16, 1975         BSA:          2.122 m Patient Age:    47  years         BP:           158/103 mmHg Patient Gender: F                HR:           95 bpm. Exam Location:  Inpatient Procedure: 2D Echo, Cardiac Doppler and Color Doppler Indications:    Chest pain  History:        Patient has no prior history of Echocardiogram examinations.  Sonographer:    Meagan Baucom RDCS, FE, PE Referring Phys: [id number] ROBERT LOCKWOOD IMPRESSIONS  1. Left ventricular ejection fraction, by estimation, is 60 to 65%. The left ventricle has normal function. The left ventricle has no regional wall motion abnormalities. Left ventricular diastolic parameters are consistent with Grade II diastolic dysfunction (pseudonormalization). Elevated left ventricular end-diastolic pressure.  2. Right ventricular systolic function is normal. The right ventricular size is normal. There is normal pulmonary artery systolic pressure.  3. Left atrial size was severely dilated.  4. A small pericardial effusion is present. The pericardial effusion is circumferential. There is no evidence of cardiac tamponade.  5. The mitral valve is normal in structure. Trivial mitral valve regurgitation. No evidence of mitral stenosis.  6. The aortic valve is normal in structure. Aortic valve regurgitation is not visualized. No aortic stenosis is present.  7. The inferior vena cava is normal in size with greater than 50% respiratory variability, suggesting right atrial pressure of 3 mmHg. Comparison(s): No prior Echocardiogram. Conclusion(s)/Recommendation(s): Normal LVEF, grade 2 diastolic dysfunction with elevated LVEDP. Small circumferential pericardial effusion without echo evidence of tamponade. FINDINGS  Left Ventricle: Left ventricular ejection fraction, by estimation, is 60 to 65%. The left ventricle has normal function. The left ventricle has no regional wall motion abnormalities. The left ventricular internal cavity size was normal in size. There is  borderline left ventricular hypertrophy. Left ventricular diastolic  parameters are consistent with Grade II diastolic dysfunction (pseudonormalization). Elevated left ventricular end-diastolic pressure. Right Ventricle: The right ventricular size is normal. Right vetricular wall thickness was not well visualized. Right ventricular systolic function is normal. There is normal pulmonary artery systolic pressure. The tricuspid regurgitant velocity is 1.39 m/s, and with an assumed right atrial pressure of 3 mmHg, the estimated right ventricular systolic pressure is 10.7 mmHg. Left Atrium: Left atrial size was severely dilated. Right Atrium: Right atrial size was normal in size. Pericardium: A small pericardial effusion is present. The pericardial effusion is circumferential. There is no evidence of cardiac tamponade. Mitral Valve: The mitral valve is normal in  structure. Trivial mitral valve regurgitation. No evidence of mitral valve stenosis. Tricuspid Valve: The tricuspid valve is normal in structure. Tricuspid valve regurgitation is trivial. No evidence of tricuspid stenosis. Aortic Valve: The aortic valve is normal in structure. Aortic valve regurgitation is not visualized. No aortic stenosis is present. Aortic valve mean gradient measures 5.0 mmHg. Aortic valve peak gradient measures 10.1 mmHg. Aortic valve area, by VTI measures 3.07 cm. Pulmonic Valve: The pulmonic valve was not well visualized. Pulmonic valve regurgitation is not visualized. Aorta: The aortic root, ascending aorta, aortic arch and descending aorta are all structurally normal, with no evidence of dilitation or obstruction. Venous: The inferior vena cava is normal in size with greater than 50% respiratory variability, suggesting right atrial pressure of 3 mmHg. IAS/Shunts: The atrial septum is grossly normal.  LEFT VENTRICLE PLAX 2D LVIDd:         5.50 cm   Diastology LVIDs:         3.70 cm   LV e' medial:    7.29 cm/s LV PW:         1.00 cm   LV E/e' medial:  15.8 LV IVS:        1.00 cm   LV e' lateral:   5.98  cm/s LVOT diam:     2.00 cm   LV E/e' lateral: 19.2 LV SV:         82 LV SV Index:   39 LVOT Area:     3.14 cm  RIGHT VENTRICLE RV S prime:     17.00 cm/s TAPSE (M-mode): 2.1 cm LEFT ATRIUM              Index        RIGHT ATRIUM           Index LA diam:        4.50 cm  2.12 cm/m   RA Area:     14.80 cm LA Vol (A2C):   56.8 ml  26.76 ml/m  RA Volume:   35.50 ml  16.73 ml/m LA Vol (A4C):   115.0 ml 54.19 ml/m LA Biplane Vol: 89.7 ml  42.27 ml/m  AORTIC VALVE AV Area (Vmax):    3.28 cm AV Area (Vmean):   3.14 cm AV Area (VTI):     3.07 cm AV Vmax:           159.00 cm/s AV Vmean:          105.000 cm/s AV VTI:            0.267 m AV Peak Grad:      10.1 mmHg AV Mean Grad:      5.0 mmHg LVOT Vmax:         166.00 cm/s LVOT Vmean:        105.000 cm/s LVOT VTI:          0.261 m LVOT/AV VTI ratio: 0.98  AORTA Ao Asc diam: 3.00 cm MITRAL VALVE                TRICUSPID VALVE MV Area (PHT): 4.41 cm     TR Peak grad:   7.7 mmHg MV Decel Time: 172 msec     TR Vmax:        139.00 cm/s MV E velocity: 115.00 cm/s MV A velocity: 79.40 cm/s   SHUNTS MV E/A ratio:  1.45         Systemic VTI:  0.26 m  Systemic Diam: 2.00 cm Jodelle Red MD Electronically signed by Jodelle Red MD Signature Date/Time: 11/14/2022/3:46:38 PM    Final    DG Chest 2 View  Result Date: 11/14/2022 CLINICAL DATA:  Shortness of breath when lying down EXAM: CHEST - 2 VIEW COMPARISON:  Radiograph 09/07/2019 FINDINGS: Interval enlargement of the cardiomediastinal silhouette suspicious for pericardial effusion. Hazy airspace and interstitial opacities in the lower lungs may be projectional or due to mild edema. No focal consolidation, pneumothorax, or pleural effusion. No displaced rib fractures. IMPRESSION: 1. Interval enlargement of the cardiomediastinal silhouette. Recommend CT or echocardiogram to evaluate for pericardial effusion. 2. Hazy airspace and interstitial opacities in the lower lungs may be  projectional or due to mild edema. Electronically Signed   By: Minerva Fester M.D.   On: 11/14/2022 02:57    EKG: Independently reviewed.  Sinus tachycardia with rate 103; nonspecific ST changes with no evidence of acute ischemia   Labs on Admission: I have personally reviewed the available labs and imaging studies at the time of the admission.  Pertinent labs:    Unremarkable CMP BNP 158 WBC 8.4 Hgb 9.2, 12.5 in 2021   Assessment and Plan: Principal Problem:   Acute on chronic diastolic (congestive) heart failure (HCC) Active Problems:   Nicotine dependence   Obesity   Essential hypertension   Iron deficiency anemia    New onset diastolic CHF -Patient without h/o known medical problems including CHF presenting with SOB -CXR consistent with mild pulmonary edema -Mildly elevated BNP without known baseline -With elevated BNP and abnl CXR, acute decompensated CHF seems probable as diagnosis -Echo performed and shows LVH, grade 2 diastolic dysfunction and a small pericardial effusion -Will place in observation status with telemetry, as there are no current findings necessitating admission (hemodynamic instability, severe electrolyte abnormalities, cardiac arrhythmias, ACS, severe pulmonary edema requiring new O2 therapy, AMS) -CHF order set utilized -Cardiology is consulting  -Was given Lasix 40 mg x 1 in ER; will hold additional doses pending response -Check lipids, A1c, TSH  HTN -Undiagnosed to date but reported concerns for this as distantly as 2017 -She does not see a PCP and has not since 2018 -Started on losartan by cardiology -Will also add prn hydralazine  Anemia -Microcytic, likely iron deficiency in the setting of menstruation -HCG ordered -Start FeSO4 -Recheck CBC in AM  Tobacco dependence -Reports that nothing has worked to help her quit in the past - including patches, gum, and Chantix -Encourage cessation.   -This was discussed with the patient and  should be reviewed on an ongoing basis.   -Patch ordered   Class 2 Obesity -Body mass index is 37.12 kg/m..  -Weight loss should be encouraged -Outpatient PCP/bariatric medicine/bariatric surgery f/u encouraged       Advance Care Planning:   Code Status: Full Code   Consults: Cardiology; CHF navigator, nutrition, TOC team  DVT Prophylaxis: Lovenox  Family Communication: None present; she is capable of communicating with family at this time  Severity of Illness: The appropriate patient status for this patient is OBSERVATION. Observation status is judged to be reasonable and necessary in order to provide the required intensity of service to ensure the patient's safety. The patient's presenting symptoms, physical exam findings, and initial radiographic and laboratory data in the context of their medical condition is felt to place them at decreased risk for further clinical deterioration. Furthermore, it is anticipated that the patient will be medically stable for discharge from the hospital within 2 midnights of  admission.   Author: Jonah Blue, MD 11/14/2022 5:39 PM  For on call review [email or web address].

## 2022-11-14 NOTE — ED Provider Notes (Signed)
Care transferred to me.  Patient appears to have new diastolic CHF.  She also has a new anemia though last hemoglobin was from 3 years ago.  She has no report of bloody or black stools, will acquire a stool sample for testing.  Cardiology is recommending admission and they would diurese and look into the anemia but are requesting a medical admission. They have also added on troponins.  Discussed with Dr. Ophelia Charter for admission.   Pricilla Loveless, MD 11/14/22 [phone]

## 2022-11-14 NOTE — Consult Note (Addendum)
Cardiology Consultation   Patient ID: Cheyenne [name] MRN: [id number]; DOB: 20-Feb-1976  Admit date: 11/14/2022 Date of Consult: 11/14/2022  PCP:  Patient, No Pcp Per   [facility] Providers Cardiologist:  None        Patient Profile:   Cheyenne [name] is a 47 y.o. 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 Jeraldine Loots.  History of Present Illness:   Cheyenne [name] denies any prior cardiac history. She reports having a h/o intermittent high BP over the years and states she's surprised no one ever put her on BP medication. She smokes cigarettes, drinks 2 [employer] Hard Lemonade 3x/week, and denies illicit drug use. She thinks one of her grandparents had CHF and a pacemaker. HTN runs in the family. She has noticed increasing SOB with exertion and nonproductive cough the last few months. Last night while watching a movie around 1am she began to notice SOB at rest with a squeezing sensation in her chest. She denies overt palpitations, dizziness, nausea, pre-syncope or syncope. She utilized her daughter's inhaler with minimal relief so presented to ED for evaluation. Here she received 2 puffs of albuterol overnight. Symptoms gradually eased off otherwise without intervention. Presenting BP 177/116, pulse 102. Denies recent s/sx of GIB or unusual bleeding otherwise. Her last menses was a week and a half ago. Labs reveal new microcytic anemia with Hgb 9.2 (previously normal at 12.5), BNP 158, albumin 3.3, normal Cr and LFTs. CXR shows interval enlargement of the cardiomediastinal silhouette raising question of pericardial effusion, also hazy airspace and interstitial opacities in the lower lungs may be projectional or due to mild edema. 2D echo today showed EF 60-65%, G2DD, elevated LVEDP, normal RV, severe LAE, small pericardial effusion, normal  IVC. EKG shows sinus tach with possible LAE, poor R wave progression, nonspecific STTW changes, possible LVH. She reports intermittent mild ankle edema. She does not weigh herself but feels she's gained weight in the last several weeks. She has not had any recent prolonged travel, surgery, or bedrest. She is currently without chest pain or dyspnea.   Past Medical History:  Diagnosis Date   Detached retina, left     Past Surgical History:  Procedure Laterality Date   EYE SURGERY Left    detached visiion     Home Medications:  Prior to Admission medications   Medication Sig Start Date End Date Taking? Authorizing Provider  aspirin-acetaminophen-caffeine (EXCEDRIN MIGRAINE) [phone] MG tablet Take 1 tablet by mouth every 6 (six) hours as needed for headache or migraine.    [provider]  ibuprofen (ADVIL) 600 MG tablet Take 1 tablet (600 mg total) by mouth every 6 (six) hours as needed. 05/07/20   Fayrene Helper, PA-C  meclizine (ANTIVERT) 50 MG tablet Take 1 tablet (50 mg total) by mouth 3 (three) times daily as needed. 09/30/19   Melene Plan, DO  Multiple Vitamin (MULTIVITAMIN WITH MINERALS) TABS tablet Take 1 tablet by mouth daily.    [provider]  albuterol (PROVENTIL HFA;VENTOLIN HFA) 108 (90 Base) MCG/ACT inhaler Inhale 2 puffs into the lungs every 6 (six) hours as needed for wheezing or shortness of breath. Patient not taking: Reported on 09/07/2019 05/12/17 09/07/19  Olive Bass, FNP    Inpatient Medications: Scheduled Meds:  Continuous Infusions:  PRN Meds: albuterol  Allergies:   No Known  Allergies  Social History:   Social History   Socioeconomic History   Marital status: Single    Spouse name: Not on file   Number of children: Not on file   Years of education: Not on file   Highest education level: Not on file  Occupational History   Not on file  Tobacco Use   Smoking status: Some Days    Types: Cigarettes   Smokeless tobacco:  Never  Vaping Use   Vaping Use: Never used  Substance and Sexual Activity   Alcohol use: Yes    Comment: occasionally    Drug use: No   Sexual activity: Not on file  Other Topics Concern   Not on file  Social History Narrative   Works at [employer]   No regular exercise - walks a lot at work   Social Determinants of Corporate investment banker Strain: Not on file  Food Insecurity: Not on file  Transportation Needs: Not on file  Physical Activity: Not on file  Stress: Not on file  Social Connections: Not on file  Intimate Partner Violence: Not on file    Family History:    Family History  Adopted: Yes  Problem Relation Age of Onset   Hypertension Mother    Osteoarthritis Mother    Hypertension Other    Diabetes Other      ROS:  Please see the history of present illness.  All other ROS reviewed and negative.     Physical Exam/Data:   Vitals:   11/14/22 0830 11/14/22 0845 11/14/22 1000 11/14/22 1150  BP: (!) 160/98 (!) 163/116 (!) 158/103   Pulse: 98 88 92   Resp: (!) 27 (!) 25 18   Temp:    98 F (36.7 C)  SpO2: 100% 100% 98%   Weight:      Height:       No intake or output data in the 24 hours ending 11/14/22 1601    11/14/2022    2:29 AM 03/19/2020   11:09 PM 09/30/2019    2:43 PM  Last 3 Weights  Weight (lbs) 230 lb 230 lb 260 lb  Weight (kg) 104.327 kg 104.327 kg 117.935 kg     Body mass index is 37.12 kg/m.  General: Well developed, well nourished, in no acute distress. Head: Normocephalic, atraumatic, mild scleral icterus, divergence of L eye, no xanthomas, nares are without discharge. Neck: Negative for carotid bruits. JVP not elevated. Lungs: Coarse BS to auscultation without wheezes, rales, or rhonchi. Breathing is unlabored. Heart: RRR borderline tachycardic S1 S2 without murmurs, rubs, or gallops.  Abdomen: Soft, non-tender, non-distended with normoactive bowel sounds. No rebound/guarding. Extremities: No clubbing or cyanosis.  Trace sockline edema. Distal pedal pulses are 2+ and equal bilaterally. Neuro: Alert and oriented X 3. Moves all extremities spontaneously. Psych:  Responds to questions appropriately with a normal affect.   EKG:  The EKG was personally reviewed and demonstrates:   sinus tachycardia 103bpm, possible LAE, poor R wave progression, nonspecific STTW changes, possible LVH   Telemetry:  Telemetry was personally reviewed and demonstrates:  N/A, not on telemetry, in hallway bed  Relevant CV Studies: 2d echo today    1. Left ventricular ejection fraction, by estimation, is 60 to 65%. The  left ventricle has normal function. The left ventricle has no regional  wall motion abnormalities. Left ventricular diastolic parameters are  consistent with Grade II diastolic  dysfunction (pseudonormalization). Elevated left ventricular end-diastolic  pressure.   2.  Right ventricular systolic function is normal. The right ventricular  size is normal. There is normal pulmonary artery systolic pressure.   3. Left atrial size was severely dilated.   4. A small pericardial effusion is present. The pericardial effusion is  circumferential. There is no evidence of cardiac tamponade.   5. The mitral valve is normal in structure. Trivial mitral valve  regurgitation. No evidence of mitral stenosis.   6. The aortic valve is normal in structure. Aortic valve regurgitation is  not visualized. No aortic stenosis is present.   7. The inferior vena cava is normal in size with greater than 50%  respiratory variability, suggesting right atrial pressure of 3 mmHg.   Comparison(s): No prior Echocardiogram.   Conclusion(s)/Recommendation(s): Normal LVEF, grade 2 diastolic  dysfunction with elevated LVEDP. Small circumferential pericardial  effusion without echo evidence of tamponade.   Laboratory Data:  High Sensitivity Troponin:  No results for input(s): "TROPONINIHS" in the last 720 hours.   Chemistry Recent Labs   Lab 11/14/22 0923  NA 137  K 3.5  CL 102  CO2 23  GLUCOSE 90  BUN 8  CREATININE 0.86  CALCIUM 8.9  GFRNONAA >60  ANIONGAP 12    Recent Labs  Lab 11/14/22 0923  PROT 7.3  ALBUMIN 3.3*  AST 18  ALT 14  ALKPHOS 54  BILITOT 0.3   Lipids No results for input(s): "CHOL", "TRIG", "HDL", "LABVLDL", "LDLCALC", "CHOLHDL" in the last 168 hours.  Hematology Recent Labs  Lab 11/14/22 0923  WBC 8.4  RBC 4.12  HGB 9.2*  HCT 30.0*  MCV 72.8*  MCH 22.3*  MCHC 30.7  RDW 16.7*  PLT 399   Thyroid No results for input(s): "TSH", "FREET4" in the last 168 hours.  BNP Recent Labs  Lab 11/14/22 0923  BNP 158.0*    DDimer No results for input(s): "DDIMER" in the last 168 hours.   Radiology/Studies:  ECHOCARDIOGRAM COMPLETE  Result Date: 11/14/2022    ECHOCARDIOGRAM REPORT   Patient Name:   RUBYLEE ZAMARRIPA Date of Exam: 11/14/2022 Medical Rec #:  [id number]        Height:       66.0 in Accession #:    [id number]       Weight:       230.0 lb Date of Birth:  1975/06/18         BSA:          2.122 m Patient Age:    47 years         BP:           158/103 mmHg Patient Gender: F                HR:           95 bpm. Exam Location:  Inpatient Procedure: 2D Echo, Cardiac Doppler and Color Doppler Indications:    Chest pain  History:        Patient has no prior history of Echocardiogram examinations.  Sonographer:    Meagan Baucom RDCS, FE, PE Referring Phys: [id number] ROBERT LOCKWOOD IMPRESSIONS  1. Left ventricular ejection fraction, by estimation, is 60 to 65%. The left ventricle has normal function. The left ventricle has no regional wall motion abnormalities. Left ventricular diastolic parameters are consistent with Grade II diastolic dysfunction (pseudonormalization). Elevated left ventricular end-diastolic pressure.  2. Right ventricular systolic function is normal. The right ventricular size is normal. There is normal pulmonary artery systolic pressure.  3. Left atrial size  was severely dilated.  4.  A small pericardial effusion is present. The pericardial effusion is circumferential. There is no evidence of cardiac tamponade.  5. The mitral valve is normal in structure. Trivial mitral valve regurgitation. No evidence of mitral stenosis.  6. The aortic valve is normal in structure. Aortic valve regurgitation is not visualized. No aortic stenosis is present.  7. The inferior vena cava is normal in size with greater than 50% respiratory variability, suggesting right atrial pressure of 3 mmHg. Comparison(s): No prior Echocardiogram. Conclusion(s)/Recommendation(s): Normal LVEF, grade 2 diastolic dysfunction with elevated LVEDP. Small circumferential pericardial effusion without echo evidence of tamponade. FINDINGS  Left Ventricle: Left ventricular ejection fraction, by estimation, is 60 to 65%. The left ventricle has normal function. The left ventricle has no regional wall motion abnormalities. The left ventricular internal cavity size was normal in size. There is  borderline left ventricular hypertrophy. Left ventricular diastolic parameters are consistent with Grade II diastolic dysfunction (pseudonormalization). Elevated left ventricular end-diastolic pressure. Right Ventricle: The right ventricular size is normal. Right vetricular wall thickness was not well visualized. Right ventricular systolic function is normal. There is normal pulmonary artery systolic pressure. The tricuspid regurgitant velocity is 1.39 m/s, and with an assumed right atrial pressure of 3 mmHg, the estimated right ventricular systolic pressure is 10.7 mmHg. Left Atrium: Left atrial size was severely dilated. Right Atrium: Right atrial size was normal in size. Pericardium: A small pericardial effusion is present. The pericardial effusion is circumferential. There is no evidence of cardiac tamponade. Mitral Valve: The mitral valve is normal in structure. Trivial mitral valve regurgitation. No evidence of mitral valve stenosis. Tricuspid  Valve: The tricuspid valve is normal in structure. Tricuspid valve regurgitation is trivial. No evidence of tricuspid stenosis. Aortic Valve: The aortic valve is normal in structure. Aortic valve regurgitation is not visualized. No aortic stenosis is present. Aortic valve mean gradient measures 5.0 mmHg. Aortic valve peak gradient measures 10.1 mmHg. Aortic valve area, by VTI measures 3.07 cm. Pulmonic Valve: The pulmonic valve was not well visualized. Pulmonic valve regurgitation is not visualized. Aorta: The aortic root, ascending aorta, aortic arch and descending aorta are all structurally normal, with no evidence of dilitation or obstruction. Venous: The inferior vena cava is normal in size with greater than 50% respiratory variability, suggesting right atrial pressure of 3 mmHg. IAS/Shunts: The atrial septum is grossly normal.  LEFT VENTRICLE PLAX 2D LVIDd:         5.50 cm   Diastology LVIDs:         3.70 cm   LV e' medial:    7.29 cm/s LV PW:         1.00 cm   LV E/e' medial:  15.8 LV IVS:        1.00 cm   LV e' lateral:   5.98 cm/s LVOT diam:     2.00 cm   LV E/e' lateral: 19.2 LV SV:         82 LV SV Index:   39 LVOT Area:     3.14 cm  RIGHT VENTRICLE RV S prime:     17.00 cm/s TAPSE (M-mode): 2.1 cm LEFT ATRIUM              Index        RIGHT ATRIUM           Index LA diam:        4.50 cm  2.12 cm/m   RA Area:  14.80 cm LA Vol (A2C):   56.8 ml  26.76 ml/m  RA Volume:   35.50 ml  16.73 ml/m LA Vol (A4C):   115.0 ml 54.19 ml/m LA Biplane Vol: 89.7 ml  42.27 ml/m  AORTIC VALVE AV Area (Vmax):    3.28 cm AV Area (Vmean):   3.14 cm AV Area (VTI):     3.07 cm AV Vmax:           159.00 cm/s AV Vmean:          105.000 cm/s AV VTI:            0.267 m AV Peak Grad:      10.1 mmHg AV Mean Grad:      5.0 mmHg LVOT Vmax:         166.00 cm/s LVOT Vmean:        105.000 cm/s LVOT VTI:          0.261 m LVOT/AV VTI ratio: 0.98  AORTA Ao Asc diam: 3.00 cm MITRAL VALVE                TRICUSPID VALVE MV Area  (PHT): 4.41 cm     TR Peak grad:   7.7 mmHg MV Decel Time: 172 msec     TR Vmax:        139.00 cm/s MV E velocity: 115.00 cm/s MV A velocity: 79.40 cm/s   SHUNTS MV E/A ratio:  1.45         Systemic VTI:  0.26 m                             Systemic Diam: 2.00 cm Jodelle Red MD Electronically signed by Jodelle Red MD Signature Date/Time: 11/14/2022/3:46:38 PM    Final    DG Chest 2 View  Result Date: 11/14/2022 CLINICAL DATA:  Shortness of breath when lying down EXAM: CHEST - 2 VIEW COMPARISON:  Radiograph 09/07/2019 FINDINGS: Interval enlargement of the cardiomediastinal silhouette suspicious for pericardial effusion. Hazy airspace and interstitial opacities in the lower lungs may be projectional or due to mild edema. No focal consolidation, pneumothorax, or pleural effusion. No displaced rib fractures. IMPRESSION: 1. Interval enlargement of the cardiomediastinal silhouette. Recommend CT or echocardiogram to evaluate for pericardial effusion. 2. Hazy airspace and interstitial opacities in the lower lungs may be projectional or due to mild edema. Electronically Signed   By: Minerva Fester M.D.   On: 11/14/2022 02:57     Assessment and Plan:   1. Shortness of breath and chest tightness, presenting with elevated BP, possible CHF on CXR, and new anemia - ?multifactorial - see below for each concern - will also check troponins and d-dimer - will plan to s/o to on call APP if not returned by shift change - if d-dimer elevated, would pursue CTA to exclude PE given SOB and sinus tachycardia on admission - possibly some improvement with albuterol inhaler; will defer inhaler rx to IM  2. Elevated BP without prior diagnosis of HTN with suspected acute HFpEF - BNP mildly elevated but could be falsely low in setting of obesity - CXR raising question of interval enlarged cardiac silhouette as well as hazy opacities possibly reflective of mild edema - 2D echo with EF 60-65% with elevated  LVEDP and G2DD, severe LAE, small pericardial effusion without tamponade - check TSH with labs - per d/w MD, give IV Lasix 40mg  x 1 and start losartan 50mg  once daily  along with KCl now  3. Anemia - suspect contributing to symptoms - have put in for anemia panel, hemoccult, beta hCG - further w/u per primary team  4. Hypoalbuminemia - check UA for proteinuria  5. Small pericardial effusion - follow clinically, no s/sx of tamponade    Risk Assessment/Risk Scores:       New York Heart Association (NYHA) Functional Class NYHA Class II-III  For questions or updates, please contact [facility] Please consult [email or web address] for contact info under    Signed, Laurann Montana, PA-C  11/14/2022 4:01 PM

## 2022-11-14 NOTE — ED Provider Notes (Signed)
EMERGENCY DEPARTMENT AT [facility] Provider Note   CSN: [id number] Arrival date & time: 11/14/22  0200     History  Chief Complaint  Patient presents with   Shortness of Breath    Cheyenne [name] is a 47 y.o. female.  HPI Patient presents with dyspnea.  Onset was about 8 hours ago, while the patient was watching a movie.  Since that time she has had dyspnea, though it is improved after 1 albuterol treatment.  She has no history of bronchitis, asthma, hypertension.  No chest pain throughout, though she did have mild chest tightness when the dyspnea was most pronounced. No fever, vomiting, chills.    Home Medications Prior to Admission medications   Medication Sig Start Date End Date Taking? Authorizing Provider  aspirin-acetaminophen-caffeine (EXCEDRIN MIGRAINE) [id number] MG tablet Take 1 tablet by mouth every 6 (six) hours as needed for headache or migraine.    [provider]  ibuprofen (ADVIL) 600 MG tablet Take 1 tablet (600 mg total) by mouth every 6 (six) hours as needed. 05/07/20   Fayrene Helper, PA-C  meclizine (ANTIVERT) 50 MG tablet Take 1 tablet (50 mg total) by mouth 3 (three) times daily as needed. 09/30/19   Melene Plan, DO  Multiple Vitamin (MULTIVITAMIN WITH MINERALS) TABS tablet Take 1 tablet by mouth daily.    [provider]  albuterol (PROVENTIL HFA;VENTOLIN HFA) 108 (90 Base) MCG/ACT inhaler Inhale 2 puffs into the lungs every 6 (six) hours as needed for wheezing or shortness of breath. Patient not taking: Reported on 09/07/2019 05/12/17 09/07/19  Olive Bass, FNP      Allergies    Patient has no known allergies.    Review of Systems   Review of Systems  All other systems reviewed and are negative.   Physical Exam Updated Vital Signs BP (!) 158/103   Pulse 92   Temp 98 F (36.7 C)   Resp 18   Ht 5\' 6"  (1.676 m)   Wt 104.3 kg   LMP 11/07/2022   SpO2 98%   BMI 37.12 kg/m  Physical Exam Vitals and  nursing note reviewed.  Constitutional:      General: She is not in acute distress.    Appearance: She is well-developed.  HENT:     Head: Normocephalic and atraumatic.  Eyes:     Conjunctiva/sclera: Conjunctivae normal.  Cardiovascular:     Rate and Rhythm: Normal rate and regular rhythm.  Pulmonary:     Effort: Pulmonary effort is normal. No respiratory distress.     Breath sounds: Normal breath sounds. No stridor. No wheezing.  Abdominal:     General: There is no distension.  Skin:    General: Skin is warm and dry.  Neurological:     Mental Status: She is alert and oriented to person, place, and time.     Cranial Nerves: No cranial nerve deficit.  Psychiatric:        Mood and Affect: Mood normal.     ED Results / Procedures / Treatments   Labs (all labs ordered are listed, but only abnormal results are displayed) Labs Reviewed  BRAIN NATRIURETIC PEPTIDE - Abnormal; Notable for the following components:      Result Value   B Natriuretic Peptide 158.0 (*)    All other components within normal limits  COMPREHENSIVE METABOLIC PANEL - Abnormal; Notable for the following components:   Albumin 3.3 (*)    All other components within normal limits  CBC WITH DIFFERENTIAL/PLATELET - Abnormal; Notable for the following components:   Hemoglobin 9.2 (*)    HCT 30.0 (*)    MCV 72.8 (*)    MCH 22.3 (*)    RDW 16.7 (*)    All other components within normal limits    EKG EKG Interpretation  Date/Time:  Monday November 14 2022 02:20:01 EDT Ventricular Rate:  103 PR Interval:  142 QRS Duration: 86 QT Interval:  362 QTC Calculation: 474 R Axis:   -38 Text Interpretation: Sinus tachycardia Possible Left atrial enlargement Left axis deviation Minimal voltage criteria for LVH, may be normal variant ( R in aVL ) Nonspecific ST and T wave abnormality Abnormal ECG When compared with ECG of 07-Sep-2019 01:14, PREVIOUS ECG IS PRESENT Confirmed by Gerhard Munch [phone]) on 11/14/2022 8:04:27  AM  Radiology ECHOCARDIOGRAM COMPLETE  Result Date: 11/14/2022    ECHOCARDIOGRAM REPORT   Patient Name:   Cheyenne [name] Date of Exam: 11/14/2022 Medical Rec #:  [id number]        Height:       66.0 in Accession #:    [id number]       Weight:       230.0 lb Date of Birth:  [birth year]         BSA:          2.122 m Patient Age:    47 years         BP:           158/103 mmHg Patient Gender: F                HR:           95 bpm. Exam Location:  Inpatient Procedure: 2D Echo, Cardiac Doppler and Color Doppler Indications:    Chest pain  History:        Patient has no prior history of Echocardiogram examinations.  Sonographer:    Meagan Baucom RDCS, FE, PE Referring Phys: [id number] Jaelah Hauth IMPRESSIONS  1. Left ventricular ejection fraction, by estimation, is 60 to 65%. The left ventricle has normal function. The left ventricle has no regional wall motion abnormalities. Left ventricular diastolic parameters are consistent with Grade II diastolic dysfunction (pseudonormalization). Elevated left ventricular end-diastolic pressure.  2. Right ventricular systolic function is normal. The right ventricular size is normal. There is normal pulmonary artery systolic pressure.  3. Left atrial size was severely dilated.  4. A small pericardial effusion is present. The pericardial effusion is circumferential. There is no evidence of cardiac tamponade.  5. The mitral valve is normal in structure. Trivial mitral valve regurgitation. No evidence of mitral stenosis.  6. The aortic valve is normal in structure. Aortic valve regurgitation is not visualized. No aortic stenosis is present.  7. The inferior vena cava is normal in size with greater than 50% respiratory variability, suggesting right atrial pressure of 3 mmHg. Comparison(s): No prior Echocardiogram. Conclusion(s)/Recommendation(s): Normal LVEF, grade 2 diastolic dysfunction with elevated LVEDP. Small circumferential pericardial effusion without echo evidence of tamponade.  FINDINGS  Left Ventricle: Left ventricular ejection fraction, by estimation, is 60 to 65%. The left ventricle has normal function. The left ventricle has no regional wall motion abnormalities. The left ventricular internal cavity size was normal in size. There is  borderline left ventricular hypertrophy. Left ventricular diastolic parameters are consistent with Grade II diastolic dysfunction (pseudonormalization). Elevated left ventricular end-diastolic pressure. Right Ventricle: The right ventricular size is normal. Right vetricular wall thickness was not well visualized.  Right ventricular systolic function is normal. There is normal pulmonary artery systolic pressure. The tricuspid regurgitant velocity is 1.39 m/s, and with an assumed right atrial pressure of 3 mmHg, the estimated right ventricular systolic pressure is 10.7 mmHg. Left Atrium: Left atrial size was severely dilated. Right Atrium: Right atrial size was normal in size. Pericardium: A small pericardial effusion is present. The pericardial effusion is circumferential. There is no evidence of cardiac tamponade. Mitral Valve: The mitral valve is normal in structure. Trivial mitral valve regurgitation. No evidence of mitral valve stenosis. Tricuspid Valve: The tricuspid valve is normal in structure. Tricuspid valve regurgitation is trivial. No evidence of tricuspid stenosis. Aortic Valve: The aortic valve is normal in structure. Aortic valve regurgitation is not visualized. No aortic stenosis is present. Aortic valve mean gradient measures 5.0 mmHg. Aortic valve peak gradient measures 10.1 mmHg. Aortic valve area, by VTI measures 3.07 cm. Pulmonic Valve: The pulmonic valve was not well visualized. Pulmonic valve regurgitation is not visualized. Aorta: The aortic root, ascending aorta, aortic arch and descending aorta are all structurally normal, with no evidence of dilitation or obstruction. Venous: The inferior vena cava is normal in size with greater  than 50% respiratory variability, suggesting right atrial pressure of 3 mmHg. IAS/Shunts: The atrial septum is grossly normal.  LEFT VENTRICLE PLAX 2D LVIDd:         5.50 cm   Diastology LVIDs:         3.70 cm   LV e' medial:    7.29 cm/s LV PW:         1.00 cm   LV E/e' medial:  15.8 LV IVS:        1.00 cm   LV e' lateral:   5.98 cm/s LVOT diam:     2.00 cm   LV E/e' lateral: 19.2 LV SV:         82 LV SV Index:   39 LVOT Area:     3.14 cm  RIGHT VENTRICLE RV S prime:     17.00 cm/s TAPSE (M-mode): 2.1 cm LEFT ATRIUM              Index        RIGHT ATRIUM           Index LA diam:        4.50 cm  2.12 cm/m   RA Area:     14.80 cm LA Vol (A2C):   56.8 ml  26.76 ml/m  RA Volume:   35.50 ml  16.73 ml/m LA Vol (A4C):   115.0 ml 54.19 ml/m LA Biplane Vol: 89.7 ml  42.27 ml/m  AORTIC VALVE AV Area (Vmax):    3.28 cm AV Area (Vmean):   3.14 cm AV Area (VTI):     3.07 cm AV Vmax:           159.00 cm/s AV Vmean:          105.000 cm/s AV VTI:            0.267 m AV Peak Grad:      10.1 mmHg AV Mean Grad:      5.0 mmHg LVOT Vmax:         166.00 cm/s LVOT Vmean:        105.000 cm/s LVOT VTI:          0.261 m LVOT/AV VTI ratio: 0.98  AORTA Ao Asc diam: 3.00 cm MITRAL VALVE  TRICUSPID VALVE MV Area (PHT): 4.41 cm     TR Peak grad:   7.7 mmHg MV Decel Time: 172 msec     TR Vmax:        139.00 cm/s MV E velocity: 115.00 cm/s MV A velocity: 79.40 cm/s   SHUNTS MV E/A ratio:  1.45         Systemic VTI:  0.26 m                             Systemic Diam: 2.00 cm Jodelle Red MD Electronically signed by Jodelle Red MD Signature Date/Time: 11/14/2022/3:46:38 PM    Final    DG Chest 2 View  Result Date: 11/14/2022 CLINICAL DATA:  Shortness of breath when lying down EXAM: CHEST - 2 VIEW COMPARISON:  Radiograph 09/07/2019 FINDINGS: Interval enlargement of the cardiomediastinal silhouette suspicious for pericardial effusion. Hazy airspace and interstitial opacities in the lower lungs may be  projectional or due to mild edema. No focal consolidation, pneumothorax, or pleural effusion. No displaced rib fractures. IMPRESSION: 1. Interval enlargement of the cardiomediastinal silhouette. Recommend CT or echocardiogram to evaluate for pericardial effusion. 2. Hazy airspace and interstitial opacities in the lower lungs may be projectional or due to mild edema. Electronically Signed   By: Minerva Fester M.D.   On: 11/14/2022 02:57    Procedures Procedures    Medications Ordered in ED Medications  albuterol (VENTOLIN HFA) 108 (90 Base) MCG/ACT inhaler 2 puff (2 puffs Inhalation Given 11/14/22 0242)    ED Course/ Medical Decision Making/ A&P                             Medical Decision Making Adult female, smoker, presents with dyspnea.  Patient has negligible chest pain, only pressure with breathing difficulty which is not currently present.  Some suspicion for COPD given her history of smoking, though hypertensive emergency given her diastolic greater than 100 is another consideration.  Lower suspicion for atypical ACS, pneumonia, PE, without hypoxia, increased work of breathing. Patient's risk profile is otherwise low in these regards. Cardiac 95 sinus normal Pulse ox 100% room air normal   Amount and/or Complexity of Data Reviewed External Data Reviewed: notes. Labs: ordered. Decision-making details documented in ED Course. Radiology: ordered and independent interpretation performed. Decision-making details documented in ED Course. ECG/medicine tests: ordered and independent interpretation performed. Decision-making details documented in ED Course.  Risk Prescription drug management. Decision regarding hospitalization. Diagnosis or treatment significantly limited by social determinants of health.   Update: On repeat exam the patient is awake, alert, speaking clearly, no increased work of breathing, no oxygen dependency.  She remains mildly hypertensive.  We discussed all  findings, labs, x-ray.  No evidence for decompensated state.  With elevated BNP, cardiomegaly on x-ray echocardiograms been ordered, cardiology has been consulted. On signout the patient is awaiting results from both of these.         Final Clinical Impression(s) / ED Diagnoses Final diagnoses:  SOB (shortness of breath)    Rx / DC Orders ED Discharge Orders     None         Gerhard Munch, MD 11/14/22 1547

## 2022-11-15 DIAGNOSIS — E785 Hyperlipidemia, unspecified: Secondary | ICD-10-CM | POA: Diagnosis not present

## 2022-11-15 DIAGNOSIS — I5033 Acute on chronic diastolic (congestive) heart failure: Secondary | ICD-10-CM | POA: Diagnosis not present

## 2022-11-15 DIAGNOSIS — I10 Essential (primary) hypertension: Secondary | ICD-10-CM | POA: Diagnosis not present

## 2022-11-15 LAB — BASIC METABOLIC PANEL
Anion gap: 9 (ref 5–15)
BUN: 9 mg/dL (ref 6–20)
CO2: 23 mmol/L (ref 22–32)
Calcium: 8.4 mg/dL — ABNORMAL LOW (ref 8.9–10.3)
Chloride: 105 mmol/L (ref 98–111)
Creatinine, Ser: 0.84 mg/dL (ref 0.44–1.00)
GFR, Estimated: >60 mL/min (ref >60)
Glucose, Bld: 98 mg/dL (ref 70–99)
Potassium: 3.8 mmol/L (ref 3.5–5.1)
Sodium: 137 mmol/L (ref 135–145)

## 2022-11-15 LAB — LIPID PANEL
Cholesterol: 179 mg/dL (ref 0–200)
HDL: 25 mg/dL — ABNORMAL LOW (ref >40)
LDL Cholesterol: 119 mg/dL — ABNORMAL HIGH (ref 0–99)
Total CHOL/HDL Ratio: 7.2 RATIO
Triglycerides: 174 mg/dL — ABNORMAL HIGH (ref <150)
VLDL: 35 mg/dL (ref 0–40)

## 2022-11-15 LAB — CBC
HCT: 29.1 % — ABNORMAL LOW (ref 36.0–46.0)
Hemoglobin: 8.9 g/dL — ABNORMAL LOW (ref 12.0–15.0)
MCH: 22.3 pg — ABNORMAL LOW (ref 26.0–34.0)
MCHC: 30.6 g/dL (ref 30.0–36.0)
MCV: 72.8 fL — ABNORMAL LOW (ref 80.0–100.0)
Platelets: 392 10*3/uL (ref 150–400)
RBC: 4 MIL/uL (ref 3.87–5.11)
RDW: 16.8 % — ABNORMAL HIGH (ref 11.5–15.5)
WBC: 9.8 10*3/uL (ref 4.0–10.5)
nRBC: 0 % (ref 0.0–0.2)

## 2022-11-15 MED ORDER — LOSARTAN POTASSIUM 25 MG PO TABS: 25.0000 mg | ORAL_TABLET | Freq: Every day | ORAL | 0 refills | Status: DC

## 2022-11-15 MED ORDER — EMPAGLIFLOZIN 10 MG PO TABS: 10.0000 mg | ORAL_TABLET | Freq: Every day | ORAL | Status: DC

## 2022-11-15 MED ORDER — ATORVASTATIN CALCIUM 40 MG PO TABS: 40.0000 mg | ORAL_TABLET | Freq: Every day | ORAL | 0 refills | Status: DC

## 2022-11-15 MED ORDER — FUROSEMIDE 10 MG/ML IJ SOLN
40.0000 mg | Freq: Every day | INTRAMUSCULAR | Status: DC
  Administered 2022-11-15: 40 mg via INTRAVENOUS
  Filled 2022-11-15: qty 4

## 2022-11-15 MED ORDER — SPIRONOLACTONE 25 MG PO TABS
25.0000 mg | ORAL_TABLET | Freq: Every day | ORAL | Status: DC
  Administered 2022-11-15: 25 mg via ORAL
  Filled 2022-11-15: qty 1

## 2022-11-15 MED ORDER — FERROUS SULFATE 325 (65 FE) MG PO TBEC: 325.0000 mg | DELAYED_RELEASE_TABLET | Freq: Every day | ORAL | 0 refills | Status: DC

## 2022-11-15 MED ORDER — NICOTINE 14 MG/24HR TD PT24: 14.0000 mg | MEDICATED_PATCH | Freq: Every day | TRANSDERMAL | 0 refills | Status: DC

## 2022-11-15 MED ORDER — SODIUM CHLORIDE 0.9 % IV SOLN
510.0000 mg | Freq: Once | INTRAVENOUS | Status: DC
  Filled 2022-11-15: qty 17

## 2022-11-15 MED ORDER — ATORVASTATIN CALCIUM 40 MG PO TABS
40.0000 mg | ORAL_TABLET | Freq: Every day | ORAL | Status: DC
  Administered 2022-11-15: 40 mg via ORAL
  Filled 2022-11-15: qty 1

## 2022-11-15 MED ORDER — EMPAGLIFLOZIN 10 MG PO TABS: 10.0000 mg | ORAL_TABLET | Freq: Every day | ORAL | 0 refills | Status: DC

## 2022-11-15 MED ORDER — SPIRONOLACTONE 25 MG PO TABS: 25.0000 mg | ORAL_TABLET | Freq: Every day | ORAL | 0 refills | Status: DC

## 2022-11-15 NOTE — Discharge Summary (Signed)
Physician Discharge Summary  MACK THURMON ZOX:[id number] DOB: 22-Mar-1976 DOA: 11/14/2022  PCP: Patient, No Pcp Per  Admit date: 11/14/2022 Discharge date: 11/15/2022  Time spent: 45 minutes  Recommendations for Outpatient Follow-up:  [facility], follow-up in 2 weeks, please check BMP at follow-up PCP in 1 week Consider outpatient sleep study, recommend weight loss and lifestyle modification, smoking cessation Needs gastroenterology evaluation for iron deficiency anemia   Discharge Diagnoses:  Principal Problem:   Acute on chronic diastolic (congestive) heart failure (HCC) Active Problems:   Nicotine dependence   Obesity   Essential hypertension   Iron deficiency anemia   Dyslipidemia, goal LDL below 70 Tobacco use  Discharge Condition: Improved  Diet recommendation: Low-sodium, heart healthy  Filed Weights   11/14/22 0229  Weight: 104.3 kg    History of present illness:  47 y.o. female without known medical history presenting with SOB.  She reports acute onset of SOB with mild chest tightness that came on last night.  She used her daughter's albuterol with some improvement.  She has never been told that she has HTN but has noticed intermittently high BP over the years.  She does not see a PCP - last was in 2018.  She is a smoker and has not tolerated Chantix, and patches and gum "don't work" for her.  She does still have menses, has h/o anemia associated with menses.    Hospital Course:   Acute diastolic CHF, new diagnosis -Presented acute onset dyspnea, chest x-ray was indicative of mild CHF -Echo noted preserved EF, grade 2 diastolic dysfunction, LVH -Improved with Lasix x 2, followed by cardiology this admission -Transitioned to oral Aldactone, losartan and Jardiance -Follow-up with [facility] in 1 to 2 weeks, needs BMP check at follow-up -Advised weight loss, sleep study and smoking cessation   HTN -Started on losartan by cards  Dyslipidemia -LDL was  119, started on statin by cards  Iron deficiency anemia -Likely secondary to menorrhagia however also needs screening colonoscopy, GI follow-up as outpatient   Tobacco dependence -Cessation counseled, nicotine patch ordered   Class 2 Obesity -Body mass index is 37.12 kg/m..  -Weight loss, diet and lifestyle modification recommended  Discharge Exam: Vitals:   11/15/22 0953 11/15/22 1039  BP: (!) 153/98   Pulse: 89   Resp: 18   Temp:  98 F (36.7 C)  SpO2: 95%     Gen: Awake, Alert, Oriented X 3,  HEENT: no JVD Lungs: Good air movement bilaterally, CTAB CVS: S1S2/RRR Abd: soft, Non tender, non distended, BS present Extremities: No edema Skin: no new rashes on exposed skin   Discharge Instructions   Discharge Instructions     Diet - low sodium heart healthy   Complete by: As directed    Increase activity slowly   Complete by: As directed       Allergies as of 11/15/2022   No Known Allergies      Medication List     STOP taking these medications    aspirin-acetaminophen-caffeine 250-250-65 MG tablet Commonly known as: EXCEDRIN MIGRAINE       TAKE these medications    atorvastatin 40 MG tablet Commonly known as: LIPITOR Take 1 tablet (40 mg total) by mouth daily. Start taking on: November 16, 2022   empagliflozin 10 MG Tabs tablet Commonly known as: JARDIANCE Take 1 tablet (10 mg total) by mouth daily.   ferrous sulfate 325 (65 FE) MG EC tablet Take 1 tablet (325 mg total) by mouth  daily with breakfast.   losartan 25 MG tablet Commonly known as: COZAAR Take 1 tablet (25 mg total) by mouth daily. Start taking on: November 16, 2022   nicotine 14 mg/24hr patch Commonly known as: NICODERM CQ - dosed in mg/24 hours Place 1 patch (14 mg total) onto the skin daily. Start taking on: November 16, 2022   spironolactone 25 MG tablet Commonly known as: ALDACTONE Take 1 tablet (25 mg total) by mouth daily. Start taking on: November 16, 2022       No Known  Allergies    The results of significant diagnostics from this hospitalization (including imaging, microbiology, ancillary and laboratory) are listed below for reference.    Significant Diagnostic Studies: ECHOCARDIOGRAM COMPLETE  Result Date: 11/14/2022    ECHOCARDIOGRAM REPORT   Patient Name:   HEYDY MONTILLA Date of Exam: 11/14/2022 Medical Rec #:  [id number]        Height:       66.0 in Accession #:    [id number]       Weight:       230.0 lb Date of Birth:  [birth year]         BSA:          2.122 m Patient Age:    47 years         BP:           158/103 mmHg Patient Gender: F                HR:           95 bpm. Exam Location:  Inpatient Procedure: 2D Echo, Cardiac Doppler and Color Doppler Indications:    Chest pain  History:        Patient has no prior history of Echocardiogram examinations.  Sonographer:    Meagan Baucom RDCS, FE, PE Referring Phys: [id number] ROBERT LOCKWOOD IMPRESSIONS  1. Left ventricular ejection fraction, by estimation, is 60 to 65%. The left ventricle has normal function. The left ventricle has no regional wall motion abnormalities. Left ventricular diastolic parameters are consistent with Grade II diastolic dysfunction (pseudonormalization). Elevated left ventricular end-diastolic pressure.  2. Right ventricular systolic function is normal. The right ventricular size is normal. There is normal pulmonary artery systolic pressure.  3. Left atrial size was severely dilated.  4. A small pericardial effusion is present. The pericardial effusion is circumferential. There is no evidence of cardiac tamponade.  5. The mitral valve is normal in structure. Trivial mitral valve regurgitation. No evidence of mitral stenosis.  6. The aortic valve is normal in structure. Aortic valve regurgitation is not visualized. No aortic stenosis is present.  7. The inferior vena cava is normal in size with greater than 50% respiratory variability, suggesting right atrial pressure of 3 mmHg. Comparison(s): No  prior Echocardiogram. Conclusion(s)/Recommendation(s): Normal LVEF, grade 2 diastolic dysfunction with elevated LVEDP. Small circumferential pericardial effusion without echo evidence of tamponade. FINDINGS  Left Ventricle: Left ventricular ejection fraction, by estimation, is 60 to 65%. The left ventricle has normal function. The left ventricle has no regional wall motion abnormalities. The left ventricular internal cavity size was normal in size. There is  borderline left ventricular hypertrophy. Left ventricular diastolic parameters are consistent with Grade II diastolic dysfunction (pseudonormalization). Elevated left ventricular end-diastolic pressure. Right Ventricle: The right ventricular size is normal. Right vetricular wall thickness was not well visualized. Right ventricular systolic function is normal. There is normal pulmonary artery systolic pressure. The tricuspid regurgitant velocity is 1.39  m/s, and with an assumed right atrial pressure of 3 mmHg, the estimated right ventricular systolic pressure is 10.7 mmHg. Left Atrium: Left atrial size was severely dilated. Right Atrium: Right atrial size was normal in size. Pericardium: A small pericardial effusion is present. The pericardial effusion is circumferential. There is no evidence of cardiac tamponade. Mitral Valve: The mitral valve is normal in structure. Trivial mitral valve regurgitation. No evidence of mitral valve stenosis. Tricuspid Valve: The tricuspid valve is normal in structure. Tricuspid valve regurgitation is trivial. No evidence of tricuspid stenosis. Aortic Valve: The aortic valve is normal in structure. Aortic valve regurgitation is not visualized. No aortic stenosis is present. Aortic valve mean gradient measures 5.0 mmHg. Aortic valve peak gradient measures 10.1 mmHg. Aortic valve area, by VTI measures 3.07 cm. Pulmonic Valve: The pulmonic valve was not well visualized. Pulmonic valve regurgitation is not visualized. Aorta: The  aortic root, ascending aorta, aortic arch and descending aorta are all structurally normal, with no evidence of dilitation or obstruction. Venous: The inferior vena cava is normal in size with greater than 50% respiratory variability, suggesting right atrial pressure of 3 mmHg. IAS/Shunts: The atrial septum is grossly normal.  LEFT VENTRICLE PLAX 2D LVIDd:         5.50 cm   Diastology LVIDs:         3.70 cm   LV e' medial:    7.29 cm/s LV PW:         1.00 cm   LV E/e' medial:  15.8 LV IVS:        1.00 cm   LV e' lateral:   5.98 cm/s LVOT diam:     2.00 cm   LV E/e' lateral: 19.2 LV SV:         82 LV SV Index:   39 LVOT Area:     3.14 cm  RIGHT VENTRICLE RV S prime:     17.00 cm/s TAPSE (M-mode): 2.1 cm LEFT ATRIUM              Index        RIGHT ATRIUM           Index LA diam:        4.50 cm  2.12 cm/m   RA Area:     14.80 cm LA Vol (A2C):   56.8 ml  26.76 ml/m  RA Volume:   35.50 ml  16.73 ml/m LA Vol (A4C):   115.0 ml 54.19 ml/m LA Biplane Vol: 89.7 ml  42.27 ml/m  AORTIC VALVE AV Area (Vmax):    3.28 cm AV Area (Vmean):   3.14 cm AV Area (VTI):     3.07 cm AV Vmax:           159.00 cm/s AV Vmean:          105.000 cm/s AV VTI:            0.267 m AV Peak Grad:      10.1 mmHg AV Mean Grad:      5.0 mmHg LVOT Vmax:         166.00 cm/s LVOT Vmean:        105.000 cm/s LVOT VTI:          0.261 m LVOT/AV VTI ratio: 0.98  AORTA Ao Asc diam: 3.00 cm MITRAL VALVE                TRICUSPID VALVE MV Area (PHT): 4.41 cm     TR Peak  grad:   7.7 mmHg MV Decel Time: 172 msec     TR Vmax:        139.00 cm/s MV E velocity: 115.00 cm/s MV A velocity: 79.40 cm/s   SHUNTS MV E/A ratio:  1.45         Systemic VTI:  0.26 m                             Systemic Diam: 2.00 cm Jodelle Red MD Electronically signed by Jodelle Red MD Signature Date/Time: 11/14/2022/3:46:38 PM    Final    DG Chest 2 View  Result Date: 11/14/2022 CLINICAL DATA:  Shortness of breath when lying down EXAM: CHEST - 2 VIEW COMPARISON:   Radiograph 09/07/2019 FINDINGS: Interval enlargement of the cardiomediastinal silhouette suspicious for pericardial effusion. Hazy airspace and interstitial opacities in the lower lungs may be projectional or due to mild edema. No focal consolidation, pneumothorax, or pleural effusion. No displaced rib fractures. IMPRESSION: 1. Interval enlargement of the cardiomediastinal silhouette. Recommend CT or echocardiogram to evaluate for pericardial effusion. 2. Hazy airspace and interstitial opacities in the lower lungs may be projectional or due to mild edema. Electronically Signed   By: Minerva Fester M.D.   On: 11/14/2022 02:57    Microbiology: No results found for this or any previous visit (from the past 240 hour(s)).   Labs: Basic Metabolic Panel: Recent Labs  Lab 11/14/22 0923 11/15/22 0331  NA 137 137  K 3.5 3.8  CL 102 105  CO2 23 23  GLUCOSE 90 98  BUN 8 9  CREATININE 0.86 0.84  CALCIUM 8.9 8.4*   Liver Function Tests: Recent Labs  Lab 11/14/22 0923  AST 18  ALT 14  ALKPHOS 54  BILITOT 0.3  PROT 7.3  ALBUMIN 3.3*   No results for input(s): "LIPASE", "AMYLASE" in the last 168 hours. No results for input(s): "AMMONIA" in the last 168 hours. CBC: Recent Labs  Lab 11/14/22 0923 11/15/22 0331  WBC 8.4 9.8  NEUTROABS 4.8  --   HGB 9.2* 8.9*  HCT 30.0* 29.1*  MCV 72.8* 72.8*  PLT 399 392   Cardiac Enzymes: No results for input(s): "CKTOTAL", "CKMB", "CKMBINDEX", "TROPONINI" in the last 168 hours. BNP: BNP (last 3 results) Recent Labs    11/14/22 0923  BNP 158.0*    ProBNP (last 3 results) No results for input(s): "PROBNP" in the last 8760 hours.  CBG: No results for input(s): "GLUCAP" in the last 168 hours.     Signed:  Zannie Cove MD.  [facility] 11/15/2022, 11:02 AM

## 2022-11-15 NOTE — Progress Notes (Addendum)
Rounding Note    Patient Name: Cheyenne [name] Date of Encounter: 11/15/2022  [facility] Cardiologist: None   Subjective   Patient without any complaints.  No longer having any chest tightness.  Inpatient Medications    Scheduled Meds:  enoxaparin (LOVENOX) injection  50 mg Subcutaneous Q24H   furosemide  40 mg Intravenous Daily   losartan  50 mg Oral Daily   nicotine  14 mg Transdermal Daily   sodium chloride flush  3 mL Intravenous Q12H   Continuous Infusions:  ferumoxytol (FERAHEME) 510 mg in sodium chloride 0.9 % 100 mL IVPB     PRN Meds: acetaminophen **OR** acetaminophen, albuterol, bisacodyl, hydrALAZINE, ondansetron **OR** ondansetron (ZOFRAN) IV, polyethylene glycol, traZODone   Vital Signs    Vitals:   11/14/22 1638 11/14/22 2131 11/15/22 0228 11/15/22 0652  BP: (!) 156/98 (!) 146/112 (!) 146/101 (!) 134/98  Pulse: 97 92 87 90  Resp: 18 17 17 18   Temp: 98.4 F (36.9 C) 97.9 F (36.6 C) 98.1 F (36.7 C) 98.4 F (36.9 C)  TempSrc: Oral Oral Oral Oral  SpO2: 96% 100% 98% 99%  Weight:      Height:       No intake or output data in the 24 hours ending 11/15/22 0908    11/14/2022    2:29 AM 03/19/2020   11:09 PM 09/30/2019    2:43 PM  Last 3 Weights  Weight (lbs) 230 lb 230 lb 260 lb  Weight (kg) 104.327 kg 104.327 kg 117.935 kg      Telemetry    Not hooked up to telemetry- Personally Reviewed  ECG    No new tracings- Personally Reviewed  Physical Exam   GEN: No acute distress.   Neck: No JVD Cardiac: RRR, no murmurs, rubs, or gallops.  Respiratory: Clear to auscultation bilaterally. GI: Soft, nontender, non-distended  MS: No edema; No deformity. Neuro:  Nonfocal  Psych: Normal affect   Labs    High Sensitivity Troponin:   Recent Labs  Lab 11/14/22 1934  TROPONINIHS 12     Chemistry Recent Labs  Lab 11/14/22 0923 11/15/22 0331  NA 137 137  K 3.5 3.8  CL 102 105  CO2 23 23  GLUCOSE 90 98  BUN 8 9  CREATININE  0.86 0.84  CALCIUM 8.9 8.4*  PROT 7.3  --   ALBUMIN 3.3*  --   AST 18  --   ALT 14  --   ALKPHOS 54  --   BILITOT 0.3  --   GFRNONAA >60 >60  ANIONGAP 12 9    Lipids  Recent Labs  Lab 11/15/22 0331  CHOL 179  TRIG 174*  HDL 25*  LDLCALC 119*  CHOLHDL 7.2    Hematology Recent Labs  Lab 11/14/22 0923 11/14/22 1934 11/15/22 0331  WBC 8.4  --  9.8  RBC 4.12 4.32 4.00  HGB 9.2*  --  8.9*  HCT 30.0*  --  29.1*  MCV 72.8*  --  72.8*  MCH 22.3*  --  22.3*  MCHC 30.7  --  30.6  RDW 16.7*  --  16.8*  PLT 399  --  392   Thyroid  Recent Labs  Lab 11/14/22 1934  TSH 0.973    BNP Recent Labs  Lab 11/14/22 0923  BNP 158.0*    DDimer No results for input(s): "DDIMER" in the last 168 hours.   Radiology    ECHOCARDIOGRAM COMPLETE  Result Date: 11/14/2022    ECHOCARDIOGRAM  REPORT   Patient Name:   Cheyenne [name] Date of Exam: 11/14/2022 Medical Rec #:  [id number]        Height:       66.0 in Accession #:    [id number]       Weight:       230.0 lb Date of Birth:  [birth year]         BSA:          2.122 m Patient Age:    47 years         BP:           158/103 mmHg Patient Gender: F                HR:           95 bpm. Exam Location:  Inpatient Procedure: 2D Echo, Cardiac Doppler and Color Doppler Indications:    Chest pain  History:        Patient has no prior history of Echocardiogram examinations.  Sonographer:    Meagan Baucom RDCS, FE, PE Referring Phys: [id number] ROBERT LOCKWOOD IMPRESSIONS  1. Left ventricular ejection fraction, by estimation, is 60 to 65%. The left ventricle has normal function. The left ventricle has no regional wall motion abnormalities. Left ventricular diastolic parameters are consistent with Grade II diastolic dysfunction (pseudonormalization). Elevated left ventricular end-diastolic pressure.  2. Right ventricular systolic function is normal. The right ventricular size is normal. There is normal pulmonary artery systolic pressure.  3. Left atrial size was  severely dilated.  4. A small pericardial effusion is present. The pericardial effusion is circumferential. There is no evidence of cardiac tamponade.  5. The mitral valve is normal in structure. Trivial mitral valve regurgitation. No evidence of mitral stenosis.  6. The aortic valve is normal in structure. Aortic valve regurgitation is not visualized. No aortic stenosis is present.  7. The inferior vena cava is normal in size with greater than 50% respiratory variability, suggesting right atrial pressure of 3 mmHg. Comparison(s): No prior Echocardiogram. Conclusion(s)/Recommendation(s): Normal LVEF, grade 2 diastolic dysfunction with elevated LVEDP. Small circumferential pericardial effusion without echo evidence of tamponade. FINDINGS  Left Ventricle: Left ventricular ejection fraction, by estimation, is 60 to 65%. The left ventricle has normal function. The left ventricle has no regional wall motion abnormalities. The left ventricular internal cavity size was normal in size. There is  borderline left ventricular hypertrophy. Left ventricular diastolic parameters are consistent with Grade II diastolic dysfunction (pseudonormalization). Elevated left ventricular end-diastolic pressure. Right Ventricle: The right ventricular size is normal. Right vetricular wall thickness was not well visualized. Right ventricular systolic function is normal. There is normal pulmonary artery systolic pressure. The tricuspid regurgitant velocity is 1.39 m/s, and with an assumed right atrial pressure of 3 mmHg, the estimated right ventricular systolic pressure is 10.7 mmHg. Left Atrium: Left atrial size was severely dilated. Right Atrium: Right atrial size was normal in size. Pericardium: A small pericardial effusion is present. The pericardial effusion is circumferential. There is no evidence of cardiac tamponade. Mitral Valve: The mitral valve is normal in structure. Trivial mitral valve regurgitation. No evidence of mitral valve  stenosis. Tricuspid Valve: The tricuspid valve is normal in structure. Tricuspid valve regurgitation is trivial. No evidence of tricuspid stenosis. Aortic Valve: The aortic valve is normal in structure. Aortic valve regurgitation is not visualized. No aortic stenosis is present. Aortic valve mean gradient measures 5.0 mmHg. Aortic valve peak gradient measures 10.1 mmHg. Aortic valve area, by  VTI measures 3.07 cm. Pulmonic Valve: The pulmonic valve was not well visualized. Pulmonic valve regurgitation is not visualized. Aorta: The aortic root, ascending aorta, aortic arch and descending aorta are all structurally normal, with no evidence of dilitation or obstruction. Venous: The inferior vena cava is normal in size with greater than 50% respiratory variability, suggesting right atrial pressure of 3 mmHg. IAS/Shunts: The atrial septum is grossly normal.  LEFT VENTRICLE PLAX 2D LVIDd:         5.50 cm   Diastology LVIDs:         3.70 cm   LV e' medial:    7.29 cm/s LV PW:         1.00 cm   LV E/e' medial:  15.8 LV IVS:        1.00 cm   LV e' lateral:   5.98 cm/s LVOT diam:     2.00 cm   LV E/e' lateral: 19.2 LV SV:         82 LV SV Index:   39 LVOT Area:     3.14 cm  RIGHT VENTRICLE RV S prime:     17.00 cm/s TAPSE (M-mode): 2.1 cm LEFT ATRIUM              Index        RIGHT ATRIUM           Index LA diam:        4.50 cm  2.12 cm/m   RA Area:     14.80 cm LA Vol (A2C):   56.8 ml  26.76 ml/m  RA Volume:   35.50 ml  16.73 ml/m LA Vol (A4C):   115.0 ml 54.19 ml/m LA Biplane Vol: 89.7 ml  42.27 ml/m  AORTIC VALVE AV Area (Vmax):    3.28 cm AV Area (Vmean):   3.14 cm AV Area (VTI):     3.07 cm AV Vmax:           159.00 cm/s AV Vmean:          105.000 cm/s AV VTI:            0.267 m AV Peak Grad:      10.1 mmHg AV Mean Grad:      5.0 mmHg LVOT Vmax:         166.00 cm/s LVOT Vmean:        105.000 cm/s LVOT VTI:          0.261 m LVOT/AV VTI ratio: 0.98  AORTA Ao Asc diam: 3.00 cm MITRAL VALVE                TRICUSPID  VALVE MV Area (PHT): 4.41 cm     TR Peak grad:   7.7 mmHg MV Decel Time: 172 msec     TR Vmax:        139.00 cm/s MV E velocity: 115.00 cm/s MV A velocity: 79.40 cm/s   SHUNTS MV E/A ratio:  1.45         Systemic VTI:  0.26 m                             Systemic Diam: 2.00 cm Jodelle Red MD Electronically signed by Jodelle Red MD Signature Date/Time: 11/14/2022/3:46:38 PM    Final    DG Chest 2 View  Result Date: 11/14/2022 CLINICAL DATA:  Shortness of breath when lying down EXAM: CHEST - 2 VIEW COMPARISON:  Radiograph 09/07/2019 FINDINGS: Interval enlargement of the cardiomediastinal silhouette suspicious for pericardial effusion. Hazy airspace and interstitial opacities in the lower lungs may be projectional or due to mild edema. No focal consolidation, pneumothorax, or pleural effusion. No displaced rib fractures. IMPRESSION: 1. Interval enlargement of the cardiomediastinal silhouette. Recommend CT or echocardiogram to evaluate for pericardial effusion. 2. Hazy airspace and interstitial opacities in the lower lungs may be projectional or due to mild edema. Electronically Signed   By: Minerva Fester M.D.   On: 11/14/2022 02:57    Cardiac Studies   Echocardiogram 11/14/2022  1. Left ventricular ejection fraction, by estimation, is 60 to 65%. The  left ventricle has normal function. The left ventricle has no regional  wall motion abnormalities. Left ventricular diastolic parameters are  consistent with Grade II diastolic  dysfunction (pseudonormalization). Elevated left ventricular end-diastolic  pressure.   2. Right ventricular systolic function is normal. The right ventricular  size is normal. There is normal pulmonary artery systolic pressure.   3. Left atrial size was severely dilated.   4. A small pericardial effusion is present. The pericardial effusion is  circumferential. There is no evidence of cardiac tamponade.   5. The mitral valve is normal in structure. Trivial  mitral valve  regurgitation. No evidence of mitral stenosis.   6. The aortic valve is normal in structure. Aortic valve regurgitation is  not visualized. No aortic stenosis is present.   7. The inferior vena cava is normal in size with greater than 50%  respiratory variability, suggesting right atrial pressure of 3 mmHg.   Comparison(s): No prior Echocardiogram.   Conclusion(s)/Recommendation(s): Normal LVEF, grade 2 diastolic  dysfunction with elevated LVEDP. Small circumferential pericardial  effusion without echo evidence of tamponade.   Patient Profile     47 y.o. 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  BNP mildly elevated at 158 however not an accurate depiction in setting of obesity.  Chest x-ray indicated enlarged cardiac silhouette with hazy opacities possibly reflecting mild edema.  Echocardiogram on this admission LVEF of 60 to 65% with grade 2 diastolic dysfunction, severe left atrium enlargement, small pericardial effusion without signs of tamponade.  + orthopnea, cough, minor peripheral edema. She has been given IV Lasix 40 mg however her I's and O's have not been measured and patient denies any effect. Overall looks grossly euvolemic on exam Continue losartan 50 mg daily, BP better controlled today. Consider transitioning to entresto. Continue plan for starting spirolactone today at 25mg  Defer need for further diuresis to MD.   HLD ASCVD 13.0%. LDL 119, will initiate atorvastatin 40mg .   Anemia Per primary   For questions or updates, please contact [facility] Please consult [email or web address] for contact info under        Signed, Abagail Kitchens, PA-C  11/15/2022, 9:08 AM

## 2022-11-15 NOTE — ED Notes (Signed)
Patient A&Ox4 no confusion noted. Patient ambulating around room without any complaints of SOB. No s/s of any distress. Patient able to follow all verbal commands. Will continue to monitor

## 2022-11-15 NOTE — Progress Notes (Signed)
PROGRESS NOTE    Cheyenne [name]  OZD:[id number] DOB: 06-05-1975 DOA: 11/14/2022 PCP: Patient, No Pcp Per  47/F with history of tobacco use presented to the ED with shortness of breath, blood pressure has been intermittently high over the years, history of anemia. -She noticed chest tightness and shortness of breath yesterday, in the ER BP 146/112, other vital signs stable, BNP 158, troponin 12, creatinine 0.8, hemoglobin 9.2.  Chest x-ray with interstitial opacities?  Mild edema   Subjective: -Feels better, denies complaints this morning  Assessment and Plan:  Acute diastolic CHF, new diagnosis -Echo with preserved EF -Likely worsened by anemia -IV Lasix x 1 today, continue losartan -?  Add SGLT2i-await cards input  Iron deficiency anemia -Likely secondary to menorrhagia -Needs screening colonoscopy as well -Will administer IV iron today and needs oral iron at discharge  Hypertension -Started on losartan yesterday  Tobacco abuse -Counseled, nicotine patch  Obesity -BMI 37, recommended weight loss and lifestyle modification    DVT prophylaxis: Lovenox Code Status: Full code Family Communication: None present Disposition Plan: Home later today or in a.m.  Consultants: Cardiology   Procedures:   Antimicrobials:    Objective: Vitals:   11/14/22 1638 11/14/22 2131 11/15/22 0228 11/15/22 0652  BP: (!) 156/98 (!) 146/112 (!) 146/101 (!) 134/98  Pulse: 97 92 87 90  Resp: 18 17 17 18   Temp: 98.4 F (36.9 C) 97.9 F (36.6 C) 98.1 F (36.7 C) 98.4 F (36.9 C)  TempSrc: Oral Oral Oral Oral  SpO2: 96% 100% 98% 99%  Weight:      Height:       No intake or output data in the 24 hours ending 11/15/22 0905 Filed Weights   11/14/22 0229  Weight: 104.3 kg    Examination:  General exam: Obese female sitting up in bed, AAOx3, no distress HEENT: No JVD CVS: S1-S2, regular rhythm Lungs: Decreased breath sound at the bases otherwise clear Abdomen: Soft,  nontender, bowel sounds present Extremities: Trace edema  Skin: No rashes Psychiatry:  Mood & affect appropriate.     Data Reviewed:   CBC: Recent Labs  Lab 11/14/22 0923 11/15/22 0331  WBC 8.4 9.8  NEUTROABS 4.8  --   HGB 9.2* 8.9*  HCT 30.0* 29.1*  MCV 72.8* 72.8*  PLT 399 392   Basic Metabolic Panel: Recent Labs  Lab 11/14/22 0923 11/15/22 0331  NA 137 137  K 3.5 3.8  CL 102 105  CO2 23 23  GLUCOSE 90 98  BUN 8 9  CREATININE 0.86 0.84  CALCIUM 8.9 8.4*   GFR: Estimated Creatinine Clearance: 101 mL/min (by C-G formula based on SCr of 0.84 mg/dL). Liver Function Tests: Recent Labs  Lab 11/14/22 0923  AST 18  ALT 14  ALKPHOS 54  BILITOT 0.3  PROT 7.3  ALBUMIN 3.3*   No results for input(s): "LIPASE", "AMYLASE" in the last 168 hours. No results for input(s): "AMMONIA" in the last 168 hours. Coagulation Profile: No results for input(s): "INR", "PROTIME" in the last 168 hours. Cardiac Enzymes: No results for input(s): "CKTOTAL", "CKMB", "CKMBINDEX", "TROPONINI" in the last 168 hours. BNP (last 3 results) No results for input(s): "PROBNP" in the last 8760 hours. HbA1C: Recent Labs    11/14/22 1934  HGBA1C 5.4   CBG: No results for input(s): "GLUCAP" in the last 168 hours. Lipid Profile: Recent Labs    11/15/22 0331  CHOL 179  HDL 25*  LDLCALC 119*  TRIG 174*  CHOLHDL 7.2  Thyroid Function Tests: Recent Labs    11/14/22 1934  TSH 0.973   Anemia Panel: Recent Labs    11/14/22 1934  VITAMINB12 240  FOLATE 8.3  FERRITIN 9*  TIBC 423  IRON 23*  RETICCTPCT 1.6   Urine analysis:    Component Value Date/Time   COLORURINE YELLOW 02/26/2018 1856   APPEARANCEUR CLOUDY (A) 02/26/2018 1856   LABSPEC 1.020 02/26/2018 1856   PHURINE 5.0 02/26/2018 1856   GLUCOSEU NEGATIVE 02/26/2018 1856   HGBUR MODERATE (A) 02/26/2018 1856   BILIRUBINUR NEGATIVE 02/26/2018 1856   KETONESUR NEGATIVE 02/26/2018 1856   PROTEINUR 100 (A) 02/26/2018 1856    NITRITE POSITIVE (A) 02/26/2018 1856   LEUKOCYTESUR LARGE (A) 02/26/2018 1856   Sepsis Labs: @LABRCNTIP (procalcitonin:4,lacticidven:4)  )No results found for this or any previous visit (from the past 240 hour(s)).   Radiology Studies: ECHOCARDIOGRAM COMPLETE  Result Date: 11/14/2022    ECHOCARDIOGRAM REPORT   Patient Name:   Cheyenne [name] Date of Exam: 11/14/2022 Medical Rec #:  [id number]        Height:       66.0 in Accession #:    [id number]       Weight:       230.0 lb Date of Birth:  October 26, 1975         BSA:          2.122 m Patient Age:    47 years         BP:           158/103 mmHg Patient Gender: F                HR:           95 bpm. Exam Location:  Inpatient Procedure: 2D Echo, Cardiac Doppler and Color Doppler Indications:    Chest pain  History:        Patient has no prior history of Echocardiogram examinations.  Sonographer:    Meagan Baucom RDCS, FE, PE Referring Phys: [id number] ROBERT LOCKWOOD IMPRESSIONS  1. Left ventricular ejection fraction, by estimation, is 60 to 65%. The left ventricle has normal function. The left ventricle has no regional wall motion abnormalities. Left ventricular diastolic parameters are consistent with Grade II diastolic dysfunction (pseudonormalization). Elevated left ventricular end-diastolic pressure.  2. Right ventricular systolic function is normal. The right ventricular size is normal. There is normal pulmonary artery systolic pressure.  3. Left atrial size was severely dilated.  4. A small pericardial effusion is present. The pericardial effusion is circumferential. There is no evidence of cardiac tamponade.  5. The mitral valve is normal in structure. Trivial mitral valve regurgitation. No evidence of mitral stenosis.  6. The aortic valve is normal in structure. Aortic valve regurgitation is not visualized. No aortic stenosis is present.  7. The inferior vena cava is normal in size with greater than 50% respiratory variability, suggesting right atrial  pressure of 3 mmHg. Comparison(s): No prior Echocardiogram. Conclusion(s)/Recommendation(s): Normal LVEF, grade 2 diastolic dysfunction with elevated LVEDP. Small circumferential pericardial effusion without echo evidence of tamponade. FINDINGS  Left Ventricle: Left ventricular ejection fraction, by estimation, is 60 to 65%. The left ventricle has normal function. The left ventricle has no regional wall motion abnormalities. The left ventricular internal cavity size was normal in size. There is  borderline left ventricular hypertrophy. Left ventricular diastolic parameters are consistent with Grade II diastolic dysfunction (pseudonormalization). Elevated left ventricular end-diastolic pressure. Right Ventricle: The right ventricular size is normal. Right vetricular  wall thickness was not well visualized. Right ventricular systolic function is normal. There is normal pulmonary artery systolic pressure. The tricuspid regurgitant velocity is 1.39 m/s, and with an assumed right atrial pressure of 3 mmHg, the estimated right ventricular systolic pressure is 10.7 mmHg. Left Atrium: Left atrial size was severely dilated. Right Atrium: Right atrial size was normal in size. Pericardium: A small pericardial effusion is present. The pericardial effusion is circumferential. There is no evidence of cardiac tamponade. Mitral Valve: The mitral valve is normal in structure. Trivial mitral valve regurgitation. No evidence of mitral valve stenosis. Tricuspid Valve: The tricuspid valve is normal in structure. Tricuspid valve regurgitation is trivial. No evidence of tricuspid stenosis. Aortic Valve: The aortic valve is normal in structure. Aortic valve regurgitation is not visualized. No aortic stenosis is present. Aortic valve mean gradient measures 5.0 mmHg. Aortic valve peak gradient measures 10.1 mmHg. Aortic valve area, by VTI measures 3.07 cm. Pulmonic Valve: The pulmonic valve was not well visualized. Pulmonic valve  regurgitation is not visualized. Aorta: The aortic root, ascending aorta, aortic arch and descending aorta are all structurally normal, with no evidence of dilitation or obstruction. Venous: The inferior vena cava is normal in size with greater than 50% respiratory variability, suggesting right atrial pressure of 3 mmHg. IAS/Shunts: The atrial septum is grossly normal.  LEFT VENTRICLE PLAX 2D LVIDd:         5.50 cm   Diastology LVIDs:         3.70 cm   LV e' medial:    7.29 cm/s LV PW:         1.00 cm   LV E/e' medial:  15.8 LV IVS:        1.00 cm   LV e' lateral:   5.98 cm/s LVOT diam:     2.00 cm   LV E/e' lateral: 19.2 LV SV:         82 LV SV Index:   39 LVOT Area:     3.14 cm  RIGHT VENTRICLE RV S prime:     17.00 cm/s TAPSE (M-mode): 2.1 cm LEFT ATRIUM              Index        RIGHT ATRIUM           Index LA diam:        4.50 cm  2.12 cm/m   RA Area:     14.80 cm LA Vol (A2C):   56.8 ml  26.76 ml/m  RA Volume:   35.50 ml  16.73 ml/m LA Vol (A4C):   115.0 ml 54.19 ml/m LA Biplane Vol: 89.7 ml  42.27 ml/m  AORTIC VALVE AV Area (Vmax):    3.28 cm AV Area (Vmean):   3.14 cm AV Area (VTI):     3.07 cm AV Vmax:           159.00 cm/s AV Vmean:          105.000 cm/s AV VTI:            0.267 m AV Peak Grad:      10.1 mmHg AV Mean Grad:      5.0 mmHg LVOT Vmax:         166.00 cm/s LVOT Vmean:        105.000 cm/s LVOT VTI:          0.261 m LVOT/AV VTI ratio: 0.98  AORTA Ao Asc diam: 3.00 cm MITRAL VALVE  TRICUSPID VALVE MV Area (PHT): 4.41 cm     TR Peak grad:   7.7 mmHg MV Decel Time: 172 msec     TR Vmax:        139.00 cm/s MV E velocity: 115.00 cm/s MV A velocity: 79.40 cm/s   SHUNTS MV E/A ratio:  1.45         Systemic VTI:  0.26 m                             Systemic Diam: 2.00 cm Jodelle Red MD Electronically signed by Jodelle Red MD Signature Date/Time: 11/14/2022/3:46:38 PM    Final    DG Chest 2 View  Result Date: 11/14/2022 CLINICAL DATA:  Shortness of breath when  lying down EXAM: CHEST - 2 VIEW COMPARISON:  Radiograph 09/07/2019 FINDINGS: Interval enlargement of the cardiomediastinal silhouette suspicious for pericardial effusion. Hazy airspace and interstitial opacities in the lower lungs may be projectional or due to mild edema. No focal consolidation, pneumothorax, or pleural effusion. No displaced rib fractures. IMPRESSION: 1. Interval enlargement of the cardiomediastinal silhouette. Recommend CT or echocardiogram to evaluate for pericardial effusion. 2. Hazy airspace and interstitial opacities in the lower lungs may be projectional or due to mild edema. Electronically Signed   By: Minerva Fester M.D.   On: 11/14/2022 02:57     Scheduled Meds:  enoxaparin (LOVENOX) injection  50 mg Subcutaneous Q24H   losartan  50 mg Oral Daily   nicotine  14 mg Transdermal Daily   sodium chloride flush  3 mL Intravenous Q12H   Continuous Infusions:   LOS: 0 days    Time spent:   Zannie Cove, MD [facility]   11/15/2022, 9:05 AM

## 2022-12-06 ENCOUNTER — Encounter: Payer: Self-pay | Admitting: Physician Assistant

## 2022-12-06 NOTE — Progress Notes (Unsigned)
Cardiology Office Note    Date:  12/07/2022   ID:  Cheyenne [name], DOB [birth year], MRN [id number]  PCP:  No primary care provider on file.  Cardiologist:  Chrystie Nose, MD  Electrophysiologist:  None   Chief Complaint: f/u CHF  History of Present Illness:   Cheyenne [name] is a 47 y.o. 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 She was recently seen in the ED 10/2022 after presenting with chest pain and SOB. Troponins were negative and BP was elevated. CXR raised question of interstitial opacities. EKG showed EF 60-65%, G2DD, elevated LVEDP, normal RV, severe LAE, small pericardial effusion, normal IVC. She was also found to have iron deficiency anemia. She was treated with IV Lasix, transitioned to losartan, spironolactone, and Jardiance at discharge. She was also started on statin and iron supplementation. She subsequently saw primary care who started GLP1, Chantix, and recommended increase in losartan to 50mg  daily. She has not yet increased losartan as she wanted to talk to [place].  She is seen back for follow-up today doing well. She has not had any recurrent chest pain. She does experience DOE with stairs. She notes occasional headache though not as bad as they used to be. She reports generalized fatigue. She states her family has said she snores. Her anemia was felt due to menorrhagia by IM, but recommended to see GI as OP for screening colonoscopy which she has not yet gotten a referral for.  Labwork independently reviewed: 10/2022 LDL 119, trig 174, Hgb 8.9, plt 392, K 3.8, Cr 0.84, hCG neg, TSH wnl, ferritin low   Past History   Past Medical History:  Diagnosis Date   Chronic heart failure with preserved ejection fraction (HFpEF) (HCC)    Detached retina, left    HTN (hypertension)    Hyperlipidemia    Iron  deficiency anemia    Obesity    Pericardial effusion     Past Surgical History:  Procedure Laterality Date   EYE SURGERY Left    detached visiion    Current Medications: Current Meds  Medication Sig   atorvastatin (LIPITOR) 40 MG tablet Take 1 tablet (40 mg total) by mouth daily.   empagliflozin (JARDIANCE) 10 MG TABS tablet Take 1 tablet (10 mg total) by mouth daily.   ferrous sulfate 325 (65 FE) MG EC tablet Take 1 tablet (325 mg total) by mouth daily with breakfast.   losartan (COZAAR) 25 MG tablet Take 1 tablet (25 mg total) by mouth daily.   losartan (COZAAR) 50 MG tablet Take 1 tablet by mouth as directed.   nicotine (NICODERM CQ - DOSED IN MG/24 HOURS) 14 mg/24hr patch Place 1 patch (14 mg total) onto the skin daily.   Semaglutide-Weight Management 0.25 MG/0.5ML SOAJ Inject into the skin as directed.   spironolactone (ALDACTONE) 25 MG tablet Take 1 tablet (25 mg total) by mouth daily.   Varenicline Tartrate, Starter, 0.5 MG X 11 & 1 MG X 42 TBPK as directed.      Allergies:   Patient has no known allergies.   Social History   Socioeconomic History   Marital status: Single    Spouse name: Not on file   Number of children: Not on file   Years of education: Not on file   Highest education level: Not on file  Occupational History   Not  on file  Tobacco Use   Smoking status: Some Days    Types: Cigarettes   Smokeless tobacco: Never  Vaping Use   Vaping status: Never Used  Substance and Sexual Activity   Alcohol use: Yes    Comment: occasionally    Drug use: No   Sexual activity: Not on file  Other Topics Concern   Not on file  Social History Narrative   Works at [employer]   No regular exercise - walks a lot at work   Social Determinants of Corporate investment banker Strain: Not on file  Food Insecurity: Not on file  Transportation Needs: Not on file  Physical Activity: Not on file  Stress: Not on file  Social Connections: Not on file      Family History:  The patient's family history includes Diabetes in an other family member; Hypertension in her mother and another family member; Osteoarthritis in her mother. She was adopted.  ROS:   Please see the history of present illness.  All other systems are reviewed and otherwise negative.    EKG(s)/Additional Testing   EKG:  EKG is  not ordered today but reviewed from 11/14/22 Sinus tach, 103bpm, possible LAE, nonspecific STTW changes (subtle sagging in leads avF, V5-V6)  CV Studies: Cardiac studies reviewed are outlined and summarized above. Otherwise please see EMR for full report.  Recent Labs: 11/14/2022: ALT 14; B Natriuretic Peptide 158.0; TSH 0.973 11/15/2022: BUN 9; Creatinine, Ser 0.84; Hemoglobin 8.9; Platelets 392; Potassium 3.8; Sodium 137  Recent Lipid Panel    Component Value Date/Time   CHOL 179 11/15/2022 0331   TRIG 174 (H) 11/15/2022 0331   HDL 25 (L) 11/15/2022 0331   CHOLHDL 7.2 11/15/2022 0331   VLDL 35 11/15/2022 0331   LDLCALC 119 (H) 11/15/2022 0331    PHYSICAL EXAM:    VS:  BP (!) 136/90   Pulse 99   Ht 5\' 6"  (1.676 m)   Wt 241 lb (109.3 kg)   LMP 11/07/2022   SpO2 96%   BMI 38.90 kg/m   BMI: Body mass index is 38.9 kg/m.  GEN: Well nourished, well developed female in no acute distress HEENT: normocephalic, atraumatic Neck: no JVD, carotid bruits, or masses Cardiac: RRR; no murmurs, rubs, or gallops, no edema  Respiratory:  clear to auscultation bilaterally, normal work of breathing GI: soft, nontender, nondistended, + BS MS: no deformity or atrophy Skin: warm and dry, no rash Neuro:  Alert and Oriented x 3, Strength and sensation are intact, follows commands Psych: euthymic mood, full affect  Wt Readings from Last 3 Encounters:  12/07/22 241 lb (109.3 kg)  11/14/22 230 lb (104.3 kg)  03/19/20 230 lb (104.3 kg)     ASSESSMENT & PLAN:   1. Chronic HFpEF with essential HTN - we do not have a recent measured weight to compare  today's value to (ER value was only stated weight, not measured), but volume status looks stable and no further chest tightness. Breathing is stable. Overall she feels she is doing well. Blood pressure remains suboptimally controlled. Did not acutely recheck in clinic as value correlates with other recent OP readings. Will get BMET with labs today with anticipation of further titration of ARB based on K/Cr vs addition of carvedilol for both BP+ HR control. I suspect some of her tachycardia may be responsive to her anemia. HF guidelines outlined on AVS. Otherwise continue present regimen. Does not appear to need standing loop diuretic presently. Also discussed  getting BP cuff to monitor at home.  2. DOE/Nonspecific abnormal EKG, HLD - would like to pursue ischemic evaluation given symptoms and nonspecifically abnormal EKG in the hospital. Cardiac CT may be challenging given baseline tendency for borderline sinus tach. Will pursue cardiac PET. Regarding lipids, continue atorvastatin (newly started in hospital) with eye towards rechecking fasting lipids/liver at next OV if tolerating this medicine.  Informed Consent   Shared Decision Making/Informed Consent The risks [chest pain, shortness of breath, cardiac arrhythmias, dizziness, blood pressure fluctuations, myocardial infarction, stroke/transient ischemic attack, nausea, vomiting, allergic reaction, radiation exposure, metallic taste sensation and life-threatening complications (estimated to be 1 in 10,000)], benefits (risk stratification, diagnosing coronary artery disease, treatment guidance) and alternatives of a cardiac PET stress test were discussed in detail with Ms. Bluestone and she agrees to proceed.    3. Iron deficiency anemia - recheck CBC today. Refer to GI for colonoscopy. Patient also encouraged to f/u OBGYN for menorrhagia.  4. Pericardial effusion - small by echo 10/2022, suspected due to HF, no tamponade. Continue to follow clinically. No  additional imaging warranted at this time.  5. Sleep disordered breathing - suspicious for OSA. Will pursue sleep study. She does not want to do the [place].     Disposition: F/u with me in 4-6 weeks.   Medication Adjustments/Labs and Tests Ordered: Current medicines are reviewed at length with the patient today.  Concerns regarding medicines are outlined above. Medication changes, Labs and Tests ordered today are summarized above and listed in the Patient Instructions accessible in Encounters.   Signed, Laurann Montana, PA-C  12/07/2022 10:29 AM    [facility] Phone: [phone]; Fax: [phone]

## 2022-12-07 ENCOUNTER — Encounter: Payer: Self-pay | Admitting: Physician Assistant

## 2022-12-07 ENCOUNTER — Ambulatory Visit: Payer: 59 | Attending: Physician Assistant | Admitting: Physician Assistant

## 2022-12-07 VITALS — BP 136/90 | HR 99 | Ht 66.0 in | Wt 241.0 lb

## 2022-12-07 DIAGNOSIS — I5032 Chronic diastolic (congestive) heart failure: Secondary | ICD-10-CM

## 2022-12-07 DIAGNOSIS — E785 Hyperlipidemia, unspecified: Secondary | ICD-10-CM | POA: Diagnosis not present

## 2022-12-07 DIAGNOSIS — R0609 Other forms of dyspnea: Secondary | ICD-10-CM

## 2022-12-07 DIAGNOSIS — I10 Essential (primary) hypertension: Secondary | ICD-10-CM

## 2022-12-07 DIAGNOSIS — I3139 Other pericardial effusion (noninflammatory): Secondary | ICD-10-CM

## 2022-12-07 DIAGNOSIS — D509 Iron deficiency anemia, unspecified: Secondary | ICD-10-CM

## 2022-12-07 DIAGNOSIS — R9431 Abnormal electrocardiogram [ECG] [EKG]: Secondary | ICD-10-CM

## 2022-12-07 DIAGNOSIS — G4730 Sleep apnea, unspecified: Secondary | ICD-10-CM

## 2022-12-07 NOTE — Patient Instructions (Addendum)
Medication Instructions:  Your physician recommends that you continue on your current medications as directed. Please refer to the Current Medication list given to you today. *If you need a refill on your cardiac medications before your next appointment, please call your pharmacy*   Lab Work: TODAY-BMET & CBC If you have labs (blood work) drawn today and your tests are completely normal, you will receive your results only by: MyChart Message (if you have MyChart) OR A paper copy in the mail If you have any lab test that is abnormal or we need to change your treatment, we will call you to review the results.   Testing/Procedures: Your physician has recommended that you have a sleep study. This test records several body functions during sleep, including: brain activity, eye movement, oxygen and carbon dioxide blood levels, heart rate and rhythm, breathing rate and rhythm, the flow of air through your mouth and nose, snoring, body muscle movements, and chest and belly movement.  CARDIAC PET STRESS TEST  Follow-Up: At [facility], you and your health needs are our priority.  As part of our continuing mission to provide you with exceptional heart care, we have created designated Provider Care Teams.  These Care Teams include your primary Cardiologist (physician) and Advanced Practice Providers (APPs -  Physician Assistants and Nurse Practitioners) who all work together to provide you with the care you need, when you need it.  We recommend signing up for the patient portal called "MyChart".  Sign up information is provided on this After Visit Summary.  MyChart is used to connect with patients for Virtual Visits (Telemedicine).  Patients are able to view lab/test results, encounter notes, upcoming appointments, etc.  Non-urgent messages can be sent to your provider as well.   To learn more about what you can do with MyChart, go to [email or web address].    Your next appointment:   4-6  week(s)  Provider:   Ronie Spies, PA-C      Other Instructions:  For patients with congestive heart failure, we give them these special instructions:  1. Follow a low-salt diet - you should be eating no more than 2,000mg  of sodium per day. This does not necessarily just apply to the salt you put on top of prepared food, but the sodium already in food. Processed food, frozen meals, canned goods, deli meat, and bread can have a surprising amount of sodium per serving so be sure to track this daily. 2. Watch your fluid intake. In general, you should not be taking in more than 2 liters of fluid per day (close to 64 oz of fluid per day). This includes sources of water in foods like soup, coffee, tea, milk, etc. It's important to stay hydrated but NOT to excess. 2. Weigh yourself on the same scale at same time of day and keep a log. 3. Call your doctor: (Anytime you feel any of the following symptoms)  - 3lb weight gain overnight or 5lb within a few days - Shortness of breath, with or without a dry hacking cough  - Swelling in the hands, feet or stomach  - If you have to sleep on extra pillows at night in order to breathe 4. For women of childbearing age, please consult your doctor before pursuing pregnancy. Some blood pressure medicines are contraindicated in pregnancy. If you become pregnant you should notify your care team right away.   IT IS IMPORTANT TO LET YOUR DOCTOR KNOW EARLY ON IF YOU ARE HAVING SYMPTOMS SO WE  CAN HELP YOU!    You have been referred to GASTROENTEROLOGY.   How to Prepare for Your Cardiac PET/CT Stress Test:  1. Please do not take these medications before your test:   Medications that may interfere with the cardiac pharmacological stress agent (ex. nitrates - including erectile dysfunction medications, isosorbide mononitrate, tamulosin or beta-blockers) the day of the exam. (Erectile dysfunction medication should be held for at least 72 hrs prior to test) Theophylline  containing medications for 12 hours. Dipyridamole 48 hours prior to the test. Your remaining medications may be taken with water.  2. Nothing to eat or drink, except water, 3 hours prior to arrival time.   NO caffeine/decaffeinated products, or chocolate 12 hours prior to arrival.  3. NO perfume, cologne or lotion  4. Total time is 1 to 2 hours; you may want to bring reading material for the waiting time.  5. Please report to Radiology at the [facility] Main Entrance 30 minutes early for your test.  [address] [place], [place] [address]  6. Please report to Radiology at [facility] Main Entrance, medical mall, 30 mins prior to your test.  [address]  [place], [place]  [phone]  Diabetic Preparation:  Hold oral medications. You may take NPH and Lantus insulin. Do not take Humalog or Humulin R (Regular Insulin) the day of your test. Check blood sugars prior to leaving the house. If able to eat breakfast prior to 3 hour fasting, you may take all medications, including your insulin, Do not worry if you miss your breakfast dose of insulin - start at your next meal.  IF YOU THINK YOU MAY BE PREGNANT, OR ARE NURSING PLEASE INFORM THE TECHNOLOGIST.  In preparation for your appointment, medication and supplies will be purchased.  Appointment availability is limited, so if you need to cancel or reschedule, please call the Radiology Department at [phone] [facility]) OR [phone] [facility])  24 hours in advance to avoid a cancellation fee of $100.00  What to Expect After you Arrive:  Once you arrive and check in for your appointment, you will be taken to a preparation room within the Radiology Department.  A technologist or Nurse will obtain your medical history, verify that you are correctly prepped for the exam, and explain the procedure.  Afterwards,  an IV will be started in your arm and electrodes will be placed on your skin for EKG  monitoring during the stress portion of the exam. Then you will be escorted to the PET/CT scanner.  There, staff will get you positioned on the scanner and obtain a blood pressure and EKG.  During the exam, you will continue to be connected to the EKG and blood pressure machines.  A small, safe amount of a radioactive tracer will be injected in your IV to obtain a series of pictures of your heart along with an injection of a stress agent.    After your Exam:  It is recommended that you eat a meal and drink a caffeinated beverage to counter act any effects of the stress agent.  Drink plenty of fluids for the remainder of the day and urinate frequently for the first couple of hours after the exam.  Your doctor will inform you of your test results within 7-10 business days.  For more information and frequently asked questions, please visit our website : [email or web address]  For questions about your test or how to prepare for your test, please call: [place]  Earlene Plater, Cardiac Imaging Nurse Navigator  Larey Brick, Cardiac Imaging Nurse Navigator [place], Cardiac Imaging Nurse Navigator Office: [phone]    .

## 2022-12-08 ENCOUNTER — Telehealth: Payer: Self-pay

## 2022-12-08 DIAGNOSIS — I10 Essential (primary) hypertension: Secondary | ICD-10-CM

## 2022-12-08 DIAGNOSIS — Z79899 Other long term (current) drug therapy: Secondary | ICD-10-CM

## 2022-12-08 DIAGNOSIS — I5032 Chronic diastolic (congestive) heart failure: Secondary | ICD-10-CM

## 2022-12-08 LAB — BASIC METABOLIC PANEL
BUN/Creatinine Ratio: 10 (ref 9–23)
BUN: 9 mg/dL (ref 6–24)
CO2: 23 mmol/L (ref 20–29)
Calcium: 9.4 mg/dL (ref 8.7–10.2)
Chloride: 102 mmol/L (ref 96–106)
Creatinine, Ser: 0.9 mg/dL (ref 0.57–1.00)
Glucose: 88 mg/dL (ref 70–99)
Potassium: 3.8 mmol/L (ref 3.5–5.2)
Sodium: 137 mmol/L (ref 134–144)
eGFR: 79 mL/min/{1.73_m2} (ref >59)

## 2022-12-08 LAB — CBC
Hematocrit: 32.2 % — ABNORMAL LOW (ref 34.0–46.6)
Hemoglobin: 9.8 g/dL — ABNORMAL LOW (ref 11.1–15.9)
MCH: 22.5 pg — ABNORMAL LOW (ref 26.6–33.0)
MCHC: 30.4 g/dL — ABNORMAL LOW (ref 31.5–35.7)
MCV: 74 fL — ABNORMAL LOW (ref 79–97)
Platelets: 461 10*3/uL — ABNORMAL HIGH (ref 150–450)
RBC: 4.35 x10E6/uL (ref 3.77–5.28)
RDW: 18.8 % — ABNORMAL HIGH (ref 11.7–15.4)
WBC: 10.3 10*3/uL (ref 3.4–10.8)

## 2022-12-08 MED ORDER — EMPAGLIFLOZIN 10 MG PO TABS: 10.0000 mg | ORAL_TABLET | Freq: Every day | ORAL | 11 refills | Status: DC

## 2022-12-08 MED ORDER — ATORVASTATIN CALCIUM 40 MG PO TABS: 40.0000 mg | ORAL_TABLET | Freq: Every day | ORAL | 3 refills | Status: DC

## 2022-12-08 MED ORDER — LOSARTAN POTASSIUM 100 MG PO TABS: 100.0000 mg | ORAL_TABLET | Freq: Every day | ORAL | 3 refills | Status: DC

## 2022-12-08 MED ORDER — SPIRONOLACTONE 25 MG PO TABS: 25.0000 mg | ORAL_TABLET | Freq: Every day | ORAL | 3 refills | Status: DC

## 2022-12-08 NOTE — Telephone Encounter (Signed)
Called and spoke with patient who verbalized understanding and need to increase Losartan to 100mg  daily. She also requests refills on her cardiac medications that the hospital issued her a 30 days supply on. Medication sent to pharmacy on file. BMET entered and scheduled for 12/19/22.

## 2022-12-08 NOTE — Telephone Encounter (Signed)
-----   Message from Laurann Montana sent at 12/08/2022  8:56 AM EDT ----- Please let pt know hemoglobin slightly improved from prior but remains abnormal along with increase platelet count which can come with continued iron deficiency. Her BMET is completely stable.  Recommendations: - please recommend she contact PCP to clarify plan for following anemia as I did not see this outlined in their post-hospital follow-up note. She was started on iron in the hospital. I referred to GI yesterday at OV for screening colonoscopy and advised she f/u GYN for her heavy menses. - since BMET is stable, let's increase losartan from 25mg  to 100mg  daily. Given her BP trends we talked about how yesterday that an increase from 25mg  to 50mg  is unlikely to get her fully to goal, hence why going to 100mg . - recheck BMET in 1 week given dose increase - remind her to get BP cuff for home and monitor at least 3 hours after AM BP meds. If she finds one week after the losartan increase that she's still seeing readings >130 on top she should notify our office

## 2022-12-19 ENCOUNTER — Ambulatory Visit: Payer: 59 | Attending: Physician Assistant

## 2022-12-22 ENCOUNTER — Encounter: Payer: Self-pay | Admitting: Gastroenterology

## 2022-12-22 ENCOUNTER — Ambulatory Visit (INDEPENDENT_AMBULATORY_CARE_PROVIDER_SITE_OTHER): Payer: 59 | Admitting: Gastroenterology

## 2022-12-22 ENCOUNTER — Other Ambulatory Visit (INDEPENDENT_AMBULATORY_CARE_PROVIDER_SITE_OTHER): Payer: 59

## 2022-12-22 VITALS — BP 124/78 | HR 122 | Ht 66.0 in | Wt 235.6 lb

## 2022-12-22 DIAGNOSIS — R5383 Other fatigue: Secondary | ICD-10-CM | POA: Diagnosis not present

## 2022-12-22 DIAGNOSIS — Z1211 Encounter for screening for malignant neoplasm of colon: Secondary | ICD-10-CM | POA: Diagnosis not present

## 2022-12-22 DIAGNOSIS — D649 Anemia, unspecified: Secondary | ICD-10-CM

## 2022-12-22 LAB — COMPREHENSIVE METABOLIC PANEL
ALT: 37 U/L — ABNORMAL HIGH (ref 0–35)
AST: 27 U/L (ref 0–37)
Albumin: 4.6 g/dL (ref 3.5–5.2)
Alkaline Phosphatase: 87 U/L (ref 39–117)
BUN: 14 mg/dL (ref 6–23)
CO2: 25 mEq/L (ref 19–32)
Calcium: 9.5 mg/dL (ref 8.4–10.5)
Chloride: 100 mEq/L (ref 96–112)
Creatinine, Ser: 0.99 mg/dL (ref 0.40–1.20)
GFR: 68.07 mL/min (ref >60.00)
Glucose, Bld: 93 mg/dL (ref 70–99)
Potassium: 3.8 mEq/L (ref 3.5–5.1)
Sodium: 135 mEq/L (ref 135–145)
Total Bilirubin: 0.7 mg/dL (ref 0.2–1.2)
Total Protein: 8.7 g/dL — ABNORMAL HIGH (ref 6.0–8.3)

## 2022-12-22 LAB — CBC WITH DIFFERENTIAL/PLATELET
Basophils Absolute: 0 10*3/uL (ref 0.0–0.1)
Basophils Relative: 0.5 % (ref 0.0–3.0)
Eosinophils Absolute: 0.1 10*3/uL (ref 0.0–0.7)
Eosinophils Relative: 1.2 % (ref 0.0–5.0)
HCT: 37.1 % (ref 36.0–46.0)
Hemoglobin: 11.6 g/dL — ABNORMAL LOW (ref 12.0–15.0)
Lymphocytes Relative: 17.2 % (ref 12.0–46.0)
Lymphs Abs: 1.5 10*3/uL (ref 0.7–4.0)
MCHC: 31.2 g/dL (ref 30.0–36.0)
MCV: 73.9 fl — ABNORMAL LOW (ref 78.0–100.0)
Monocytes Absolute: 0.6 10*3/uL (ref 0.1–1.0)
Monocytes Relative: 6.7 % (ref 3.0–12.0)
Neutro Abs: 6.5 10*3/uL (ref 1.4–7.7)
Neutrophils Relative %: 74.4 % (ref 43.0–77.0)
Platelets: 430 10*3/uL — ABNORMAL HIGH (ref 150.0–400.0)
RBC: 5.02 Mil/uL (ref 3.87–5.11)
RDW: 20.7 % — ABNORMAL HIGH (ref 11.5–15.5)
WBC: 8.8 10*3/uL (ref 4.0–10.5)

## 2022-12-22 LAB — IBC + FERRITIN
Ferritin: 43.2 ng/mL (ref 10.0–291.0)
Iron: 62 ug/dL (ref 42–145)
Saturation Ratios: 17.2 % — ABNORMAL LOW (ref 20.0–50.0)
TIBC: 361.2 ug/dL (ref 250.0–450.0)
Transferrin: 258 mg/dL (ref 212.0–360.0)

## 2022-12-22 MED ORDER — NA SULFATE-K SULFATE-MG SULF 17.5-3.13-1.6 GM/177ML PO SOLN: 1.0000 | Freq: Once | ORAL | 0 refills | Status: AC

## 2022-12-22 NOTE — Progress Notes (Signed)
Chief Complaint: New anemia Primary GI MD: Unassigned  HPI: 47 year old female history of HFpEF, hypertension, obesity, iron deficiency anemia presents for evaluation of anemia.  Last normal hemoglobin was 12.5 in 2021  Patient presented to emergency department 11/14/2022 for shortness of breath on exertion.  She was diagnosed with heart failure and improved after diuresis with IV Lasix.  She was then transition to losartan and spironolactone and Jardiance at discharge.  During ED visit she was found to have anemia with Hgb 9.2 (last Hgb 12.5 in 2021).  So she was also started on 325 mg iron daily.  She followed up with cardiology 12/07/2022 and at that time her dyspnea had improved and she was recommended to see GI, GYN, and sleep medicine.  Labs 11/14/2022 Hgb 9.2, MCV 72.8 Iron 23, TIBC 423, saturation 5% Ferritin 9 Vitamin B12 240 Folate 8.3  Labs 11/15/2022 Hgb 8.9, MCV 72.8  Labs 12/07/2022 Hgb 9.8, MCV 74  Patient denies overt bleeding.  Denies change in bowel habits.  Denies abdominal pain.  States she does sometimes have heavy menses. she reports this month she had 1 week of heavy menses and then 2 weeks later had a return of heavy menses.  She is planning to follow-up with GYN.  Denies family history of colon cancer.  Denies EGD/colonoscopy.  She states she continues to be fatigued.  She also has been told she has significant snoring at night and probably needs a CPAP.  Still having mild dyspnea on exertion but has significantly improved since her hospital visit 10/2022.  PREVIOUS GI WORKUP   None  Past Medical History:  Diagnosis Date   Chronic heart failure with preserved ejection fraction (HFpEF) (HCC)    Detached retina, left    HTN (hypertension)    Hyperlipidemia    Iron deficiency anemia    Obesity    Pericardial effusion     Past Surgical History:  Procedure Laterality Date   EYE SURGERY Left    detached visiion    Current Outpatient Medications   Medication Sig Dispense Refill   atorvastatin (LIPITOR) 40 MG tablet Take 1 tablet (40 mg total) by mouth daily. 90 tablet 3   empagliflozin (JARDIANCE) 10 MG TABS tablet Take 1 tablet (10 mg total) by mouth daily. 30 tablet 11   ferrous sulfate 325 (65 FE) MG EC tablet Take 1 tablet (325 mg total) by mouth daily with breakfast. 60 tablet 0   losartan (COZAAR) 100 MG tablet Take 1 tablet (100 mg total) by mouth daily. 90 tablet 3   nicotine (NICODERM CQ - DOSED IN MG/24 HOURS) 14 mg/24hr patch Place 1 patch (14 mg total) onto the skin daily. 28 patch 0   Semaglutide-Weight Management 0.25 MG/0.5ML SOAJ Inject into the skin as directed.     spironolactone (ALDACTONE) 25 MG tablet Take 1 tablet (25 mg total) by mouth daily. 90 tablet 3   Varenicline Tartrate, Starter, 0.5 MG X 11 & 1 MG X 42 TBPK as directed.     No current facility-administered medications for this visit.    Allergies as of 12/22/2022   (No Known Allergies)    Family History  Adopted: Yes  Problem Relation Age of Onset   Hypertension Mother    Osteoarthritis Mother    Hypertension Other    Diabetes Other     Social History   Socioeconomic History   Marital status: Single    Spouse name: Not on file   Number of children: Not on  file   Years of education: Not on file   Highest education level: Not on file  Occupational History   Not on file  Tobacco Use   Smoking status: Some Days    Types: Cigarettes   Smokeless tobacco: Never  Vaping Use   Vaping status: Never Used  Substance and Sexual Activity   Alcohol use: Yes    Comment: occasionally    Drug use: No   Sexual activity: Not on file  Other Topics Concern   Not on file  Social History Narrative   Works at [employer]   No regular exercise - walks a lot at work   Social Determinants of Corporate investment banker Strain: Not on file  Food Insecurity: Not on file  Transportation Needs: Not on file  Physical Activity: Not on file   Stress: Not on file  Social Connections: Not on file  Intimate Partner Violence: Not on file    Review of Systems:    Constitutional: No weight loss, fever, chills, weakness or fatigue HEENT: Eyes: No change in vision               Ears, Nose, Throat:  No change in hearing or congestion Skin: No rash or itching Cardiovascular: No chest pain, chest pressure or palpitations   Respiratory: No SOB or cough Gastrointestinal: See HPI and otherwise negative Genitourinary: No dysuria or change in urinary frequency Neurological: No headache, dizziness or syncope Musculoskeletal: No new muscle or joint pain Hematologic: No bleeding or bruising Psychiatric: No history of depression or anxiety    Physical Exam:  Vital signs: There were no vitals taken for this visit.  Constitutional: NAD, Well developed, Well nourished, alert and cooperative Head:  Normocephalic and atraumatic. Eyes:   PEERL, EOMI. No icterus. Conjunctiva pink. Respiratory: Respirations even and unlabored. Lungs clear to auscultation bilaterally.   No wheezes, crackles, or rhonchi.  Cardiovascular:  Regular rate and rhythm. No peripheral edema, cyanosis or pallor.  Gastrointestinal:  Soft, nondistended, nontender. No rebound or guarding. Normal bowel sounds. No appreciable masses or hepatomegaly. Rectal:  Not performed.  Msk:  Symmetrical without gross deformities. Without edema, no deformity or joint abnormality.  Neurologic:  Alert and  oriented x4;  grossly normal neurologically.  Skin:   Dry and intact without significant lesions or rashes. Psychiatric: Oriented to person, place and time. Demonstrates good judgement and reason without abnormal affect or behaviors.   RELEVANT LABS AND IMAGING: CBC    Component Value Date/Time   WBC 10.3 12/07/2022 1054   WBC 9.8 11/15/2022 0331   RBC 4.35 12/07/2022 1054   RBC 4.00 11/15/2022 0331   HGB 9.8 (L) 12/07/2022 1054   HCT 32.2 (L) 12/07/2022 1054   PLT 461 (H)  12/07/2022 1054   MCV 74 (L) 12/07/2022 1054   MCH 22.5 (L) 12/07/2022 1054   MCH 22.3 (L) 11/15/2022 0331   MCHC 30.4 (L) 12/07/2022 1054   MCHC 30.6 11/15/2022 0331   RDW 18.8 (H) 12/07/2022 1054   LYMPHSABS 2.4 11/14/2022 0923   MONOABS 0.8 11/14/2022 0923   EOSABS 0.3 11/14/2022 0923   BASOSABS 0.0 11/14/2022 0923    CMP     Component Value Date/Time   NA 137 12/19/2022 1409   K 4.2 12/19/2022 1409   CL 100 12/19/2022 1409   CO2 23 12/19/2022 1409   GLUCOSE 80 12/19/2022 1409   GLUCOSE 98 11/15/2022 0331   BUN 12 12/19/2022 1409   CREATININE 0.77 12/19/2022 1409  CALCIUM 10.1 12/19/2022 1409   PROT 7.3 11/14/2022 0923   ALBUMIN 3.3 (L) 11/14/2022 0923   AST 18 11/14/2022 0923   ALT 14 11/14/2022 0923   ALKPHOS 54 11/14/2022 0923   BILITOT 0.3 11/14/2022 0923   GFRNONAA >60 11/15/2022 0331   GFRAA >60 09/30/2019 1747     Assessment/Plan:   Anemia, unspecified type New anemia 10/2022 with hemoglobin 9.2 (last hemoglobin 12.5 in 2021).  No overt bleeding.  Appears it is improving with iron supplement.  Suspect multifactorial with her history of heavy menses.  However, cannot rule out GI blood loss -- EGD/colonoscopy for further evaluation -- I thoroughly discussed the procedure with the patient (at bedside) to include nature of the procedure, alternatives, benefits, and risks (including but not limited to bleeding, infection, perforation, anesthesia/cardiac pulmonary complications).  Patient verbalized understanding and gave verbal consent to proceed with procedure.  --- CBC, CMP, iron studies --- Continue iron supplementation  Fatigue, unspecified type Suspect fatigue is multifactorial with history of anemia and probable OSA.  Scheduled to follow-up with sleep medicine and get a CPAP soon. --- EGD/colonoscopy for evaluation of anemia --- Continue iron supplementation  Special screening for malignant neoplasms, colon No previous screening colonoscopy.  Denies  family history of colon cancer --- Colonoscopy  Follow-up per procedures  [facility] 12/22/2022, 8:41 AM  Cc: Laurann Montana, PA-C

## 2022-12-22 NOTE — Patient Instructions (Addendum)
Your provider has requested that you go to the basement level for lab work before leaving today. Press "B" on the elevator. The lab is located at the first door on the left as you exit the elevator.  You have been scheduled for a colonoscopy/EGD. Please follow written instructions given to you at your visit today.   Please pick up your prep supplies at the pharmacy within the next 1-3 days.  If you use inhalers (even only as needed), please bring them with you on the day of your procedure.  DO NOT TAKE 7 DAYS PRIOR TO TEST- Trulicity (dulaglutide) Ozempic, Wegovy (semaglutide) Mounjaro (tirzepatide) Bydureon Bcise (exanatide extended release)  DO NOT TAKE 1 DAY PRIOR TO YOUR TEST Rybelsus (semaglutide) Adlyxin (lixisenatide) Victoza (liraglutide) Byetta (exanatide) ___________________________________________________________________________  _______________________________________________________  If your blood pressure at your visit was 140/90 or greater, please contact your primary care physician to follow up on this.  _______________________________________________________  If you are age 47 or older, your body mass index should be between 23-30. Your Body mass index is 38.03 kg/m. If this is out of the aforementioned range listed, please consider follow up with your Primary Care Provider.  If you are age 45 or younger, your body mass index should be between 19-25. Your Body mass index is 38.03 kg/m. If this is out of the aformentioned range listed, please consider follow up with your Primary Care Provider.   ________________________________________________________  The [place] [facility] providers would like to encourage you to use [facility] to communicate with providers for non-urgent requests or questions.  Due to long hold times on the telephone, sending your provider a message by [facility] may be a faster and more efficient way to get a response.  Please allow 48 business hours for a  response.  Please remember that this is for non-urgent requests.  _______________________________________________________ It was a pleasure to see you today!  Thank you for trusting me with your gastrointestinal care!

## 2023-01-09 ENCOUNTER — Ambulatory Visit: Payer: 59 | Admitting: Physician Assistant

## 2023-02-12 ENCOUNTER — Encounter: Payer: Self-pay | Admitting: Certified Registered Nurse Anesthetist

## 2023-02-13 ENCOUNTER — Telehealth: Payer: Self-pay | Admitting: *Deleted

## 2023-02-13 NOTE — Telephone Encounter (Signed)
Dr. Lavon Paganini,

## 2023-02-13 NOTE — Telephone Encounter (Signed)
Dr. Lavon Paganini,  This pt is scheduled with you on 9/27. She has a h/o congestive heart failure and in July was seen by cardiology for angina and SOB.  Unfortunately she still has not had her cardiac perfusion study completed or been cleared cardiology.  We are unable to clear her without a cardiac clearance.  Thanks,  Cathlyn Parsons

## 2023-02-14 NOTE — Telephone Encounter (Signed)
Please advise patient to complete cardiac workup, will need cardiac clearance prior to proceeding with colonoscopy.  Will need to cancel the procedure on 9/27.  Patient will need to call us back once cardiac workup is completed to reschedule procedure

## 2023-02-14 NOTE — Telephone Encounter (Signed)
No answer. Left detailed message. Asked that patient return call to office to confirm that she received our message.

## 2023-02-15 ENCOUNTER — Telehealth: Payer: Self-pay | Admitting: Internal Medicine

## 2023-02-15 ENCOUNTER — Telehealth: Payer: Self-pay | Admitting: *Deleted

## 2023-02-15 DIAGNOSIS — I5032 Chronic diastolic (congestive) heart failure: Secondary | ICD-10-CM

## 2023-02-15 DIAGNOSIS — E785 Hyperlipidemia, unspecified: Secondary | ICD-10-CM

## 2023-02-15 DIAGNOSIS — R0609 Other forms of dyspnea: Secondary | ICD-10-CM

## 2023-02-15 DIAGNOSIS — I10 Essential (primary) hypertension: Secondary | ICD-10-CM

## 2023-02-15 DIAGNOSIS — R9431 Abnormal electrocardiogram [ECG] [EKG]: Secondary | ICD-10-CM

## 2023-02-15 DIAGNOSIS — G4730 Sleep apnea, unspecified: Secondary | ICD-10-CM

## 2023-02-15 NOTE — Telephone Encounter (Signed)
Patient states her colonoscopy was cancelled yesterday. She states they need a cardiac workup prior to the colonoscopy.   She states no one has called regarding a stress test.   She states her insurance and appeal were denied for sleep study.    Call to GI [facility]) Endoscopy center. They state Dr Lavon Paganini cancelled the procedure due to needing cardiac qworkup and clearance.  Asked to please send a clearance form to our office at [phone].  She will have nurse send the form.  Please advise on next step as patient has F/U in November but none of F/U has been able to be completed

## 2023-02-15 NOTE — Telephone Encounter (Signed)
Did [email or web address] order through other phone note @HCINFORMEDCONSENTCOLLAPSED @  Shared Decision Making/Informed Consent The risks [chest pain, shortness of breath, cardiac arrhythmias, dizziness, blood pressure fluctuations, myocardial infarction, stroke/transient ischemic attack, nausea, vomiting, allergic reaction, radiation exposure, metallic taste sensation and life-threatening complications (estimated to be 1 in 10,000)], benefits (risk stratification, diagnosing coronary artery disease, treatment guidance) and alternatives of a nuclear stress test were discussed in detail with Cheyenne [name] and she agrees to proceed. We had discussed the different options at her recent office visit and procedure for Lexiscan remains the same, just different imaging modality.

## 2023-02-15 NOTE — Telephone Encounter (Signed)
Incoming call from Cardiology. Requesting for a cardiac clearance form to be faxed to [phone]. Please advise, thank you.

## 2023-02-15 NOTE — Telephone Encounter (Signed)
Robin please see request.

## 2023-02-15 NOTE — Telephone Encounter (Signed)
I do not have any additional information regarding her stress test. Will route to triage to investigate why not scheduled yet. If we are not able to do this expeditiously, OK to change to Lexiscan nuclear stress test if her insurance will cover. I can re-enter the [id number] order as it is the same protocol essentially as the cardiac PET with different pictures being taken.  Find out if she is willing to do Itamar for sleep study since her insurance denied this. Otherwise we are at the mercy of them dictating her medical care.

## 2023-02-15 NOTE — Telephone Encounter (Signed)
Inbound call from patient in regards to previous note. Please advise.

## 2023-02-15 NOTE — Telephone Encounter (Signed)
Patient stated she was set up for a colonoscopy which was canceled.  Patient stated she is also supposed to get a sleep study.  Patient wants a call back to discuss plan of care and next steps.

## 2023-02-15 NOTE — Telephone Encounter (Signed)
Patient's cardiac PT was denied through insurance. Will order lexiscan. Patient is fine doing Itamar sleep study. Placed orders for both test.   Will send instructions for lexiscan through Mychart.  Below is the STOP BANG for Sleep study.  Patient Name: Cheyenne [name]        DOB: 26-Nov-1975      Height:  5\' 6"     Weight:    235 lbs  Office Name: [facility]         Referring Provider: Ronie Spies PA  Today's Date:  02/15/23  Date:   STOP BANG RISK ASSESSMENT S (snore) Have you been told that you snore?     YES   T (tired) Are you often tired, fatigued, or sleepy during the day?   YES  O (obstruction) Do you stop breathing, choke, or gasp during sleep? YES   P (pressure) Do you have or are you being treated for high blood pressure? YES   B (BMI) Is your body index greater than 35 kg/m? YES   A (age) Are you 80 years old or older? NO   N (neck) Do you have a neck circumference greater than 16 inches?   NO   G (gender) Are you a female? NO   TOTAL STOP/BANG "YES" ANSWERS 5                                                                       For Office Use Only              Procedure Order Form    YES to 3+ Stop Bang questions OR two clinical symptoms - patient qualifies for WatchPAT (CPT 95800)             Clinical Notes: Will consult Sleep Specialist and refer for management of therapy due to patient increased risk of Sleep Apnea. Ordering a sleep study due to the following two clinical symptoms: Excessive daytime sleepiness G47.10 / Gastroesophageal reflux K21.9 / Nocturia R35.1 / Morning Headaches G44.221 / Difficulty concentrating R41.840 / Memory problems or poor judgment G31.84 / Personality changes or irritability R45.4 / Loud snoring R06.83 / Depression F32.9 / Unrefreshed by sleep G47.8 / Impotence N52.9 / History of high blood pressure R03.0 / Insomnia G47.00    I understand that I am proceeding with a home sleep apnea test as ordered by my treating  physician. I understand that untreated sleep apnea is a serious cardiovascular risk factor and it is my responsibility to perform the test and seek management for sleep apnea. I will be contacted with the results and be managed for sleep apnea by a local sleep physician. I will be receiving equipment and further instructions from [facility]. I shall promptly ship back the equipment via the included mailing label. I understand my insurance will be billed for the test and as the patient I am responsible for any insurance related out-of-pocket costs incurred. I have been provided with written instructions and can call for additional video or telephonic instruction, with 24-hour availability of qualified personnel to answer any questions: Patient Help Desk [phone].  Patient Signature ______Phone Consent_ Date_______9/25/24_______________ Patient Telemedicine Verbal Consent

## 2023-02-15 NOTE — Telephone Encounter (Deleted)
 [facility] Pre-operative Risk Assessment     Request for surgical clearance:     Endoscopy Procedure  What type of surgery is being performed?     Colonoscopy/endoscopy  When is this surgery scheduled?     9/27  What type of clearance is required ?   Cardiac  Are there any medications that need to be held prior to surgery and how long? NO  Practice name and name of physician performing surgery?      [facility]  What is your office phone and fax number?      Phone- [phone]  Fax- [phone]  Anesthesia type (None, local, MAC, general) ?       MAC

## 2023-02-15 NOTE — Telephone Encounter (Signed)
SENT IN ERROR

## 2023-02-15 NOTE — Telephone Encounter (Signed)
Patient Name: Cheyenne [name]  DOB: 05/26/1975 MRN: [id number]  Primary Cardiologist: Chrystie Nose, MD  Chart reviewed as part of pre-operative protocol coverage.   See other phone note from today from our clinic staff, stating patient said her colonoscopy was cancelled and they need to complete cardiac workup before proceeding. I saw patient in 11/2022 and recommended cardiac PET stress test. I received notification today that patient's insurance denied this so we will change it to Lexiscan to get done more expeditiously.   Will plan to route final recommendation to GI once this has been completed. Will route this update to requesting provider via Epic fax so they are aware I will reach out once her stress test has been finalized.  Laurann Montana, PA-C 02/15/2023, 10:23 AM

## 2023-02-15 NOTE — Telephone Encounter (Signed)
I have created an encounter for Cardiac clearance and sent over to [facility]

## 2023-02-15 NOTE — Telephone Encounter (Deleted)
[facility] Pre-operative Risk Assessment     Request for surgical clearance:     Endoscopy Procedure  What type of surgery is being performed?     EGD  When is this surgery scheduled?     9/30  What type of clearance is required ?   Pharmacy  Are there any medications that need to be held prior to surgery and how long? 1-2 days  Practice name and name of physician performing surgery?      [facility]  What is your office phone and fax number?      Phone- [phone]  Fax- [phone]  Anesthesia type (None, local, MAC, general) ?       MAC

## 2023-02-16 ENCOUNTER — Telehealth (HOSPITAL_COMMUNITY): Payer: Self-pay | Admitting: *Deleted

## 2023-02-16 ENCOUNTER — Telehealth: Payer: Self-pay | Admitting: *Deleted

## 2023-02-16 NOTE — Telephone Encounter (Signed)
----- Message from [facility] Okey Regal F sent at 02/15/2023  1:49 PM EDT ----- Regarding: NEEDS ITAMAR SET UP; SEE NOTES Advice Only (Newest Message First) View All Conversations on this Encounter Dunn, Dayna N, PA-C3 hours ago (10:34 AM)   Did [phone] order through other phone note @[email or web address] @  Shared Decision Making/Informed Consent The risks [chest pain, shortness of breath, cardiac arrhythmias, dizziness, blood pressure fluctuations, myocardial infarction, stroke/transient ischemic attack, nausea, vomiting, allergic reaction, radiation exposure, metallic taste sensation and life-threatening complications (estimated to be 1 in 10,000)], benefits (risk stratification, diagnosing coronary artery disease, treatment guidance) and alternatives of a nuclear stress test were discussed in detail with Cheyenne [name] and she agrees to proceed. We had discussed the different options at her recent office visit and procedure for Lexiscan remains the same, just different imaging modality.     Note  Ethelda Chick, RN  You; Estrella Deeds; Thea Alken B3 hours ago (10:29 AM)   Cheyenne [name] we get this schedule ASAP, please Okey Regal- I placed order for Itamar, might be able to coordinate when she come in for test to give her instructions for test.  Virl Axe, Rinaldo Cloud, RN3 hours ago (10:26 AM)   Patient's cardiac PT was denied through insurance. Will order lexiscan. Patient is fine doing Itamar sleep study. Placed orders for both test.    Will send instructions for lexiscan through Mychart.   Below is the STOP BANG for Sleep study.   Patient Name: Cheyenne [name]                                                                          DOB: 16-May-1976                                                  Height:             5\' 6"                           Weight:            235 lbs   Office Name: [facility]                                                                                       Referring Provider: Ronie Spies PA   Today's Date:  02/15/23   Date:   STOP BANG RISK ASSESSMENT  S (snore) Have you been told that you snore?                                       YES   T (  tired) Are you often tired, fatigued, or sleepy during the day?           YES O (obstruction) Do you stop breathing, choke, or gasp during sleep? YES   P (pressure) Do you have or are you being treated for high blood pressure? YES   B (BMI) Is your body index greater than 35 kg/m? YES   A (age) Are you 7 years old or older? NO   N (neck) Do you have a neck circumference greater than 16 inches?    NO   G (gender) Are you a female? NO   TOTAL STOP/BANG "YES" ANSWERS 5                                                                           For Office Use Only                                    Procedure Order Form                                 YES to 3+ Stop Bang questions OR two clinical symptoms - patient qualifies for WatchPAT (CPT 95800)              Clinical Notes: Will consult Sleep Specialist and refer for management of therapy due to patient increased risk of Sleep Apnea. Ordering a sleep study due to the following two clinical symptoms: Excessive daytime sleepiness G47.10 / Gastroesophageal reflux K21.9 / Nocturia R35.1 / Morning Headaches G44.221 / Difficulty concentrating R41.840 / Memory problems or poor judgment G31.84 / Personality changes or irritability R45.4 / Loud snoring R06.83 / Depression F32.9 / Unrefreshed by sleep G47.8 / Impotence N52.9 / History of high blood pressure R03.0 / Insomnia G47.00      I understand that I am proceeding with a home sleep apnea test as ordered by my treating physician. I understand that untreated sleep apnea is a serious cardiovascular risk factor and it is my responsibility to perform the test and seek management for sleep apnea. I will be contacted with the results and be managed for sleep apnea by a local sleep physician. I  will be receiving equipment and further instructions from [facility]. I shall promptly ship back the equipment via the included mailing label. I understand my insurance will be billed for the test and as the patient I am responsible for any insurance related out-of-pocket costs incurred. I have been provided with written instructions and can call for additional video or telephonic instruction, with 24-hour availability of qualified personnel to answer any questions: Patient Help Desk [phone].   Patient Signature ______Phone Consent_ Date_______9/25/24_______________ Patient Telemedicine Verbal Consent       Note  Dunn, Dayna N, PA-C routed conversation to [employer] Triage4 hours ago (9:00 AM)  Laurann Montana, PA-C4 hours ago (9:00 AM)   I do not have any additional information regarding her stress test. Will route to triage to investigate why not scheduled yet. If we are not able to do this expeditiously, OK to  change to Lexiscan nuclear stress test if her insurance will cover. I can re-enter the [id number] order as it is the same protocol essentially as the cardiac PET with different pictures being taken.   Find out if she is willing to do Itamar for sleep study since her insurance denied this. Otherwise we are at the mercy of them dictating her medical care.   Note  Candie Chroman, LPN routed conversation to Laurann Montana, PA-C4 hours ago (8:52 AM)  Candie Chroman, LPN4 hours ago (8:52 AM)   Patient states her colonoscopy was cancelled yesterday. She states they need a cardiac workup prior to the colonoscopy.    She states no one has called regarding a stress test.    She states her insurance and appeal were denied for sleep study.     Call to GI [facility]) Endoscopy center. They state Dr Lavon Paganini cancelled the procedure due to needing cardiac qworkup and clearance.  Asked to please send a clearance form to our office at [phone].  She will have nurse send the  form.   Please advise on next step as patient has F/U in November but none of F/U has been able to be completed   Note  Candie Chroman, LPN  Dicie, Pippert [phone] hours ago (8:51 AM)  Candie Chroman, LPN  Kaylie, Marulanda [phone] hours ago (8:50 AM)  Andrey Campanile, Nira Retort B routed conversation to [facility] Triage5 hours ago (8:40 AM)  Andrey Campanile, Jasmin B5 hours ago (8:40 AM)  JW Patient stated she was set up for a colonoscopy which was canceled.  Patient stated she is also supposed to get a sleep study.  Patient wants a call back to discuss plan of care and next steps.   Note  Samayra, Matts [phone]  Annetta Maw

## 2023-02-16 NOTE — Telephone Encounter (Signed)
I s/w the pt today and she will be in the office tomorrow for stress test, which pt agrees to set up Itamar study then as well.

## 2023-02-16 NOTE — Telephone Encounter (Signed)
Left message on voicemail per DPR in reference to upcoming appointment scheduled on 02/17/2023 at 8:15 with detailed instructions given per Myocardial Perfusion Study Information Sheet for the test. LM to arrive 15 minutes early, and that it is imperative to arrive on time for appointment to keep from having the test rescheduled. If you need to cancel or reschedule your appointment, please call the office within 24 hours of your appointment. Failure to do so may result in a cancellation of your appointment, and a $50 no show fee. Phone number given for call back for any questions.

## 2023-02-17 ENCOUNTER — Encounter: Payer: 59 | Admitting: Gastroenterology

## 2023-02-17 ENCOUNTER — Ambulatory Visit (HOSPITAL_COMMUNITY): Payer: 59 | Attending: Cardiology

## 2023-02-17 DIAGNOSIS — I5032 Chronic diastolic (congestive) heart failure: Secondary | ICD-10-CM

## 2023-02-17 DIAGNOSIS — I10 Essential (primary) hypertension: Secondary | ICD-10-CM | POA: Diagnosis present

## 2023-02-17 DIAGNOSIS — E785 Hyperlipidemia, unspecified: Secondary | ICD-10-CM

## 2023-02-17 DIAGNOSIS — G4730 Sleep apnea, unspecified: Secondary | ICD-10-CM

## 2023-02-17 DIAGNOSIS — R0609 Other forms of dyspnea: Secondary | ICD-10-CM | POA: Diagnosis present

## 2023-02-17 DIAGNOSIS — R9431 Abnormal electrocardiogram [ECG] [EKG]: Secondary | ICD-10-CM | POA: Diagnosis present

## 2023-02-17 LAB — MYOCARDIAL PERFUSION IMAGING
Base ST Depression (mm): 0 mm
LV dias vol: 114 mL (ref 46–106)
LV sys vol: 61 mL
Nuc Stress EF: 46 %
Peak HR: 108 {beats}/min
Rest HR: 78 {beats}/min
Rest Nuclear Isotope Dose: 10.9 mCi
SDS: 2
SRS: 0
SSS: 2
ST Depression (mm): 0 mm
Stress Nuclear Isotope Dose: 32.1 mCi
TID: 1.04

## 2023-02-17 MED ORDER — TECHNETIUM TC 99M TETROFOSMIN IV KIT
10.9000 | PACK | Freq: Once | INTRAVENOUS | Status: AC | PRN
  Administered 2023-02-17: 10.9 via INTRAVENOUS

## 2023-02-17 MED ORDER — TECHNETIUM TC 99M TETROFOSMIN IV KIT
32.1000 | PACK | Freq: Once | INTRAVENOUS | Status: AC | PRN
  Administered 2023-02-17: 32.1 via INTRAVENOUS

## 2023-02-17 MED ORDER — REGADENOSON 0.4 MG/5ML IV SOLN
0.4000 mg | Freq: Once | INTRAVENOUS | Status: AC
Start: 2023-02-17 — End: 2023-02-17
  Administered 2023-02-17: 0.4 mg via INTRAVENOUS

## 2023-02-17 NOTE — Telephone Encounter (Signed)
Pt was set up with Itamar study while her for her stress test.   Patient agreement reviewed and signed on 02/17/2023.  WatchPAT issued to patient on 02/17/2023 by Danielle Rankin, CMA. Patient aware to not open the WatchPAT box until contacted with the activation PIN. Patient profile initialized in CloudPAT on 02/17/2023 by Danielle Rankin, CMA. Device serial number: [id number]  Please list Reason for Call as Advice Only and type "WatchPAT issued to patient" in the comment box.

## 2023-02-21 ENCOUNTER — Ambulatory Visit (HOSPITAL_COMMUNITY): Payer: 59

## 2023-02-21 DIAGNOSIS — G4733 Obstructive sleep apnea (adult) (pediatric): Secondary | ICD-10-CM

## 2023-02-21 HISTORY — DX: Obstructive sleep apnea (adult) (pediatric): G47.33

## 2023-02-22 ENCOUNTER — Telehealth: Payer: Self-pay

## 2023-02-22 DIAGNOSIS — I5032 Chronic diastolic (congestive) heart failure: Secondary | ICD-10-CM

## 2023-02-22 MED ORDER — CARVEDILOL 6.25 MG PO TABS: 6.2500 mg | ORAL_TABLET | Freq: Two times a day (BID) | ORAL | 3 refills | Status: DC

## 2023-02-22 NOTE — Telephone Encounter (Signed)
-----   Message from Laurann Montana sent at 02/22/2023  7:51 AM EDT ----- I had sent original preliminary result note to patient via MyChart which she saw - nuc with NO ischemia, no problems with blood flow. The study did show decreased EF 46%. I reviewed with Dr. Rennis Golden - per our conversation, please find out if she is still having any dyspnea on exertion still. If so, let's expedite a limited echo to re-evaluate ejection fraction (EF) prior to clearance for colonoscopy. If SOB has resolved completely, let me know.   It looks like blood pressure has still been elevated so regardless recommend adding carvedilol 6.25mg  BID. Submit BP/HR readings in 1 week for review. If she does not have a way to check at home, please schedule pharmD BP visit in 1-2 weeks. Keep f/u as scheduled 11/7.

## 2023-02-22 NOTE — Telephone Encounter (Signed)
The patient has been notified of the result and verbalized understanding.  All questions (if any) were answered. Cheyenne Schatz, RN 02/22/2023 4:05 PM   Patient does continue to have shortness of breath with exertion. Echocardiogram has been ordered.  Carvedilol 6.25 BID has been sent in. The patient can check her blood pressure at home  and will keep a log and let us know.

## 2023-02-28 ENCOUNTER — Ambulatory Visit (HOSPITAL_COMMUNITY): Payer: 59 | Attending: Cardiology

## 2023-02-28 DIAGNOSIS — I5032 Chronic diastolic (congestive) heart failure: Secondary | ICD-10-CM | POA: Diagnosis not present

## 2023-02-28 LAB — ECHOCARDIOGRAM LIMITED
Area-P 1/2: 4.3 cm2
S' Lateral: 3.2 cm

## 2023-03-02 ENCOUNTER — Telehealth: Payer: Self-pay | Admitting: Internal Medicine

## 2023-03-02 NOTE — Telephone Encounter (Signed)
Patient is returning call and is requesting call back.  

## 2023-03-02 NOTE — Telephone Encounter (Signed)
Results given to patient for Echo.  She states understanding.  She has no BP or HR readings.  States she is moving and things are packed.  She states needs to unpack her monitor and will get readings

## 2023-03-02 NOTE — Telephone Encounter (Signed)
Noted, will await readings before finalizing pre-procedure clearance. Will route FYI to GI to let them know I anticipate she is fine to proceed with colonoscopy once we confirm that her HR and BP are OK on the new carvedilol. Stay tuned.

## 2023-03-02 NOTE — Telephone Encounter (Signed)
Patient is requesting update on pin for sleep study. Please advise.

## 2023-03-03 NOTE — Telephone Encounter (Signed)
Prior Authorization for [facility] sent to [facility] via web portal. Tracking Number . Prior Authorization/Notification is not required for the requested service(s).  Decision ID #: [id number]  Ordering provider: Ronie Spies Associated diagnoses: G47.30 WatchPAT PA obtained on 03/03/2023 by Latrelle Dodrill, CMA. Authorization: No; tracking ID NO PA REQ Patient notified of PIN ([phone]) on 03/03/2023 via Notification Method: phone.MYCHART

## 2023-03-03 NOTE — Telephone Encounter (Signed)
Left message for the pt that I received a message from the sleep coordinator Itamar approved. PIN# [id number] left on vm. Ok to proceed.

## 2023-03-03 NOTE — Telephone Encounter (Signed)
Message sent via MyChart for patient to notify when she has updated readings. Will remove this msg from my inbox pending her return of call or message.

## 2023-03-05 ENCOUNTER — Encounter (INDEPENDENT_AMBULATORY_CARE_PROVIDER_SITE_OTHER): Payer: 59 | Admitting: Cardiology

## 2023-03-05 DIAGNOSIS — G4733 Obstructive sleep apnea (adult) (pediatric): Secondary | ICD-10-CM | POA: Diagnosis not present

## 2023-03-10 NOTE — Telephone Encounter (Addendum)
Dr Lavon Paganini see other  cardiology note, it looks like they cleared the patient to have a colonoscopy, we cancelled her last appointment

## 2023-03-15 NOTE — Telephone Encounter (Signed)
Dr Lavon Paganini it looks like we had to cancel this patient due to needing a cardiac clearance. You can read their note it looks like she was cleared.  Is it ok to go ahead and schedule her now?

## 2023-03-15 NOTE — Telephone Encounter (Signed)
Yes, cardiology said it is fine to proceed with colonoscopy.  Please schedule next available appointment.  Thank you

## 2023-03-19 ENCOUNTER — Ambulatory Visit: Payer: 59 | Attending: Physician Assistant

## 2023-03-19 DIAGNOSIS — G4730 Sleep apnea, unspecified: Secondary | ICD-10-CM

## 2023-03-19 DIAGNOSIS — E785 Hyperlipidemia, unspecified: Secondary | ICD-10-CM

## 2023-03-19 DIAGNOSIS — I5032 Chronic diastolic (congestive) heart failure: Secondary | ICD-10-CM

## 2023-03-19 DIAGNOSIS — I10 Essential (primary) hypertension: Secondary | ICD-10-CM

## 2023-03-19 DIAGNOSIS — R9431 Abnormal electrocardiogram [ECG] [EKG]: Secondary | ICD-10-CM

## 2023-03-19 DIAGNOSIS — R0609 Other forms of dyspnea: Secondary | ICD-10-CM

## 2023-03-19 NOTE — Procedures (Signed)
   SLEEP STUDY REPORT Patient Information Study Date: 03/05/2023 Patient Name: Cheyenne [name] Patient ID: [id number] Birth Date: 08-14-1975 Age: 47 Gender: Female BMI: 37.9 (W=236 lb, H=5' 6'') Stopbang: 5 Referring Physician: Ronie Spies, PA  TEST DESCRIPTION: Home sleep apnea testing was completed using the WatchPat, a Type 1 device, utilizing peripheral arterial tonometry (PAT), chest movement, actigraphy, pulse oximetry, pulse rate, body position and snore. AHI was calculated with apnea and hypopnea using valid sleep time as the denominator. RDI includes apneas, hypopneas, and RERAs. The data acquired and the scoring of sleep and all associated events were performed in accordance with the recommended standards and specifications as outlined in the AASM Manual for the Scoring of Sleep and Associated Events 2.2.0 (2015).  FINDINGS:  1. Severe Obstructive Sleep Apnea with AHI 30.5/hr.  2. No Central Sleep Apnea with pAHIc 1.8/hr.  3. Oxygen desaturations as low as 74%.  4. Mild snoring was present. O2 sats were < 88% for 11.5 min.  5. Total sleep time was 8 hrs and .  6. 29.7% of total sleep time was spent in REM sleep.  7. Shortened sleep onset latency at 5 min.  8. Shortened REM sleep onset latency at 47 min.  9. Total awakenings were 7. 10. Arrhythmia detection: None  DIAGNOSIS: Severe Obstructive Sleep Apnea (G47.33) Nocturnal Hypoxemia  RECOMMENDATIONS: 1. Clinical correlation of these findings is necessary. The decision to treat obstructive sleep apnea (OSA) is usually based on the presence of apnea symptoms or the presence of associated medical conditions such as Hypertension, Congestive Heart Failure, Atrial Fibrillation or Obesity. The most common symptoms of OSA are snoring, gasping for breath while sleeping, daytime sleepiness and fatigue. 2. Initiating apnea therapy is recommended given the presence of symptoms and/or associated conditions. Recommend  proceeding with one of the following:  a. Auto-CPAP therapy with a pressure range of 5-20cm H2O.  b. An oral appliance (OA) that can be obtained from certain dentists with expertise in sleep medicine. These are primarily of use in non-obese patients with mild and moderate disease.  c. An ENT consultation which may be useful to look for specific causes of obstruction and possible treatment options.  d. If patient is intolerant to PAP therapy, consider referral to ENT for evaluation for hypoglossal nerve stimulator. 3. Close follow-up is necessary to ensure success with CPAP or oral appliance therapy for maximum benefit . 4. A follow-up oximetry study on CPAP is recommended to assess the adequacy of therapy and determine the need for supplemental oxygen or the potential need for Bi-level therapy. An arterial blood gas to determine the adequacy of baseline ventilation and oxygenation should also be considered. 5. Healthy sleep recommendations include: adequate nightly sleep (normal 7-9 hrs/night), avoidance of caffeine after noon and alcohol near bedtime, and maintaining a sleep environment that is cool, dark and quiet. 6. Weight loss for overweight patients is recommended. Even modest amounts of weight loss can significantly improve the severity of sleep apnea. 7. Snoring recommendations include: weight loss where appropriate, side sleeping, and avoidance of alcohol before bed. 8. Operation of motor vehicle should be avoided when sleepy.  Signature: Armanda Magic, MD; [facility]; Diplomat, American Board of Sleep Medicine Electronically Signed: 03/19/2023 8:01:04 PM
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
## 2023-03-29 ENCOUNTER — Telehealth: Payer: Self-pay | Admitting: *Deleted

## 2023-03-29 DIAGNOSIS — I10 Essential (primary) hypertension: Secondary | ICD-10-CM

## 2023-03-29 DIAGNOSIS — I5032 Chronic diastolic (congestive) heart failure: Secondary | ICD-10-CM

## 2023-03-29 DIAGNOSIS — G4733 Obstructive sleep apnea (adult) (pediatric): Secondary | ICD-10-CM

## 2023-03-29 NOTE — Telephone Encounter (Signed)
The patient has been notified of the result and verbalized understanding.  All questions (if any) were answered. Latrelle Dodrill, CMA               Please let patient know that they have sleep apnea.  Recommend therapeutic CPAP titration for treatment of patient's sleep disordered breathing.  If unable to perform an in lab titration then initiate ResMed auto CPAP from 4 to 15cm H2O with heated humidity and mask of choice and overnight pulse ox on CPAP.     Will precert titration

## 2023-03-30 ENCOUNTER — Encounter: Payer: Self-pay | Admitting: Physician Assistant

## 2023-03-30 ENCOUNTER — Telehealth: Payer: Self-pay | Admitting: Licensed Clinical Social Worker

## 2023-03-30 ENCOUNTER — Ambulatory Visit: Payer: 59 | Attending: Physician Assistant | Admitting: Physician Assistant

## 2023-03-30 VITALS — BP 162/82 | HR 105 | Ht 66.0 in | Wt 234.6 lb

## 2023-03-30 DIAGNOSIS — E785 Hyperlipidemia, unspecified: Secondary | ICD-10-CM

## 2023-03-30 DIAGNOSIS — R000 Tachycardia, unspecified: Secondary | ICD-10-CM | POA: Diagnosis not present

## 2023-03-30 DIAGNOSIS — I5032 Chronic diastolic (congestive) heart failure: Secondary | ICD-10-CM | POA: Diagnosis not present

## 2023-03-30 DIAGNOSIS — I10 Essential (primary) hypertension: Secondary | ICD-10-CM

## 2023-03-30 DIAGNOSIS — I3139 Other pericardial effusion (noninflammatory): Secondary | ICD-10-CM

## 2023-03-30 DIAGNOSIS — Z01810 Encounter for preprocedural cardiovascular examination: Secondary | ICD-10-CM | POA: Diagnosis not present

## 2023-03-30 DIAGNOSIS — G4733 Obstructive sleep apnea (adult) (pediatric): Secondary | ICD-10-CM

## 2023-03-30 MED ORDER — CARVEDILOL 3.125 MG PO TABS: 3.1250 mg | ORAL_TABLET | Freq: Two times a day (BID) | ORAL | 3 refills | Status: DC
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
## 2023-03-30 NOTE — Telephone Encounter (Signed)
It looks like Coralee North was trying to reach her with the sleep team. I will forward to [place].
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
## 2023-03-30 NOTE — Patient Instructions (Signed)
Medication Instructions:  START Coreg 3.125mg  Take 1 tablet twice a day  *If you need a refill on your cardiac medications before your next appointment, please call your pharmacy*   Lab Work: TODAY-BMET & CBC If you have labs (blood work) drawn today and your tests are completely normal, you will receive your results only by: MyChart Message (if you have MyChart) OR A paper copy in the mail If you have any lab test that is abnormal or we need to change your treatment, we will call you to review the results.   Testing/Procedures: NONE ORDERED   Follow-Up: At [facility], you and your health needs are our priority.  As part of our continuing mission to provide you with exceptional heart care, we have created designated Provider Care Teams.  These Care Teams include your primary Cardiologist (physician) and Advanced Practice Providers (APPs -  Physician Assistants and Nurse Practitioners) who all work together to provide you with the care you need, when you need it.  We recommend signing up for the patient portal called "MyChart".  Sign up information is provided on this After Visit Summary.  MyChart is used to connect with patients for Virtual Visits (Telemedicine).  Patients are able to view lab/test results, encounter notes, upcoming appointments, etc.  Non-urgent messages can be sent to your provider as well.   To learn more about what you can do with MyChart, go to [email or web address].    Your next appointment:   3 month(s)  Provider:   Ronie Spies, PA-C       2 WEEKS PHARM-D APPT FOR HYPERTENSION Other Instructions  You have been referred to SOCIAL WORKER

## 2023-03-31 ENCOUNTER — Other Ambulatory Visit (HOSPITAL_BASED_OUTPATIENT_CLINIC_OR_DEPARTMENT_OTHER): Payer: Self-pay

## 2023-03-31 ENCOUNTER — Telehealth (HOSPITAL_BASED_OUTPATIENT_CLINIC_OR_DEPARTMENT_OTHER): Payer: Self-pay | Admitting: Licensed Clinical Social Worker

## 2023-03-31 LAB — BASIC METABOLIC PANEL
BUN/Creatinine Ratio: 14 (ref 9–23)
BUN: 11 mg/dL (ref 6–24)
CO2: 23 mmol/L (ref 20–29)
Calcium: 9.1 mg/dL (ref 8.7–10.2)
Chloride: 102 mmol/L (ref 96–106)
Creatinine, Ser: 0.78 mg/dL (ref 0.57–1.00)
Glucose: 77 mg/dL (ref 70–99)
Potassium: 4.1 mmol/L (ref 3.5–5.2)
Sodium: 138 mmol/L (ref 134–144)
eGFR: 94 mL/min/{1.73_m2} (ref >59)

## 2023-03-31 LAB — CBC
Hematocrit: 36.8 % (ref 34.0–46.6)
Hemoglobin: 11.5 g/dL (ref 11.1–15.9)
MCH: 25.9 pg — ABNORMAL LOW (ref 26.6–33.0)
MCHC: 31.3 g/dL — ABNORMAL LOW (ref 31.5–35.7)
MCV: 83 fL (ref 79–97)
Platelets: 397 10*3/uL (ref 150–450)
RBC: 4.44 x10E6/uL (ref 3.77–5.28)
RDW: 15.4 % (ref 11.7–15.4)
WBC: 8.8 10*3/uL (ref 3.4–10.8)

## 2023-03-31 MED ORDER — WEGOVY 0.5 MG/0.5ML ~~LOC~~ SOAJ
0.5000 mg | SUBCUTANEOUS | 3 refills | Status: DC
  Filled 2023-03-31: qty 2, 28d supply, fill #0
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
## 2023-03-31 NOTE — Telephone Encounter (Signed)
Laurann Montana, PA-C sent to Napoleon Form, MD; Marlowe Kays, CMA; Legrand Como, PA-C [facility] team, I had previously recommended Ms. Brassell have her stress test and blood pressure evaluated before proceeding with colonoscopy. Nuc and echo combined together were reassuring but unfortunately she presents back to clinic today being off all of her medicines for at least a month with significantly uncontrolled BP. I told her I would favor that we get her back on her intended regimen with pharmacy clinic follow-up here in 2 weeks - if BP improved at that time I think she can proceed but I think it's safest if we ensure she's on her intended regimen beforehand. I've written in my note for our pharmacy team to keep me posted on the results of this visit and will give you an update when she returns. Thanks! Kriste Basque, PA

## 2023-04-03 NOTE — Telephone Encounter (Signed)
Noted; appreciated.

## 2023-04-04 NOTE — Telephone Encounter (Signed)
Thank you :)

## 2023-04-05 ENCOUNTER — Telehealth (HOSPITAL_BASED_OUTPATIENT_CLINIC_OR_DEPARTMENT_OTHER): Payer: Self-pay | Admitting: Licensed Clinical Social Worker

## 2023-04-05 NOTE — Telephone Encounter (Signed)
.  H&V Care Navigation CSW Progress Note  Clinical Social Worker contacted patient by phone to f/u on assistance resources sent. Pt responded via text from [phone] that she didn't receive email. I resent today after she checked spam, she has now confirmed that she has received resources. Remain available to assist as needed.  Patient is participating in a Managed Medicaid Plan:  No, Set designer Insecurity: Food Insecurity Present (03/30/2023)  Housing: Medium Risk (03/30/2023)  Transportation Needs: No Transportation Needs (03/30/2023)  Utilities: Not At Risk (03/30/2023)  Financial Resource Strain: Medium Risk (03/30/2023)  Tobacco Use: High Risk (03/31/2023)   Received from [facility]  Health Literacy: Adequate Health Literacy (03/30/2023)    Octavio Graves, MSW, LCSW Clinical Social Worker II [facility] Heart/Vascular Care Navigation  [phone]- work cell phone (preferred) [phone]- desk phone

## 2023-04-07 ENCOUNTER — Other Ambulatory Visit (HOSPITAL_BASED_OUTPATIENT_CLINIC_OR_DEPARTMENT_OTHER): Payer: Self-pay

## 2023-04-07 MED ORDER — AMLODIPINE BESYLATE 5 MG PO TABS: 5.0000 mg | ORAL_TABLET | Freq: Every day | ORAL | 3 refills | Status: DC

## 2023-04-07 NOTE — Telephone Encounter (Addendum)
Recommend to d/c carvedilol since not tolerating well - she relayed concerns about feeling poorly with this in another message to [place]. Add amlodipine 5mg  daily. Keep pharmD BP visit as scheduled 11/22 - would recommend getting BMET at that visit to help establish baseline once back on intended med regimen. If BP still poorly controlled at that time, consider transitioning losartan to more potent ARB but trying to keep cost down per patient request.

## 2023-04-14 ENCOUNTER — Ambulatory Visit: Payer: 59 | Attending: Cardiology | Admitting: Student

## 2023-04-14 ENCOUNTER — Encounter: Payer: Self-pay | Admitting: Student

## 2023-04-14 VITALS — BP 132/96 | HR 88 | Ht 66.0 in | Wt 225.8 lb

## 2023-04-14 DIAGNOSIS — I10 Essential (primary) hypertension: Secondary | ICD-10-CM

## 2023-04-14 NOTE — Assessment & Plan Note (Signed)
Assessment and Plan: In office BP 132/92 heart rate 88 - DBP elevated  Patient resumed all her BP/HF medications on 11/7 after month without any BP medication  Reports dizziness and SOB when active around the house  Denies palpitation, chest pain or swelling  Jardiance did not restarted; advised patient to get Jardiance filled and get co-pay card from website to lower cost to $10/ month  Since amlodipine added to ARB and MRA agent will wait for couple more weeks to make any changes to her BP medications  Continue taking amlodipine 5 mg daily, losartan 100 mg daily and spironolactone 25 mg daily  Patient to check BP at home daily and to bring BP monitor and log at next visit for validation  Phone follow up due on Dec 5 and OV on Dec 26  Future plan is to resume beta-blocker and change losartan to long acting ARB likely valsartan twice daily dose    BMET today and repeat  in 3 weeks -post Jardiance initiation
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
## 2023-04-15 LAB — BASIC METABOLIC PANEL
BUN/Creatinine Ratio: 19 (ref 9–23)
BUN: 15 mg/dL (ref 6–24)
CO2: 22 mmol/L (ref 20–29)
Calcium: 9.9 mg/dL (ref 8.7–10.2)
Chloride: 102 mmol/L (ref 96–106)
Creatinine, Ser: 0.78 mg/dL (ref 0.57–1.00)
Glucose: 82 mg/dL (ref 70–99)
Potassium: 4.1 mmol/L (ref 3.5–5.2)
Sodium: 137 mmol/L (ref 134–144)
eGFR: 94 mL/min/{1.73_m2} (ref >59)

## 2023-04-27 ENCOUNTER — Telehealth: Payer: Self-pay | Admitting: Pharmacist

## 2023-04-27 NOTE — Telephone Encounter (Signed)
Call to see how her home BP trending and to discuss last BMP ( post Jardiance initiation) n/a, LVM

## 2023-05-01 MED ORDER — OLMESARTAN MEDOXOMIL 40 MG PO TABS: 40.0000 mg | ORAL_TABLET | Freq: Every day | ORAL | 0 refills | Status: DC

## 2023-05-01 NOTE — Addendum Note (Signed)
Addended by: Tylene Fantasia on: 05/01/2023 11:16 AM   Modules accepted: Orders

## 2023-05-01 NOTE — Telephone Encounter (Signed)
Spoke to patient ,tolerates Jardiance well. Her home BP ~170/103. Encourage her to cut down on salt intake. Will change losartan 100 mg to olmesartan 40 mg daily. BP f/u in 2 weeks will add HCTZ after assessing BMP.

## 2023-05-18 ENCOUNTER — Ambulatory Visit: Payer: 59 | Attending: Family Medicine
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
## 2023-05-22 ENCOUNTER — Telehealth: Payer: Self-pay

## 2023-05-22 NOTE — Telephone Encounter (Signed)
**Note De-Identified Laurelin Elson Obfuscation** I started a CPAP Titration PA through the [facility] Provider Portal. Status: PENDED Tracking #: [id number]

## 2023-05-23 NOTE — Telephone Encounter (Signed)
 Per phone note from P. Via, LPN on 25/95/63:  I started a CPAP Titration PA through the [facility] Provider Portal. Status: PENDED Tracking #: [id number]

## 2023-05-26 NOTE — Telephone Encounter (Signed)
**Note De-Identified Cheyenne [name] Obfuscation** Letter received from [facility] stating that they have denied the pts CPAP Titration as follows:   Notes we received say you have had diagnostic sleep testing that showed sleep apnea. You do not have health problems that support the need for this sleep testing done at a sleep center instead of a home sleep test. Therefore, this testing done at the center is not medically necessary and is not covered under your health plan. You may be eligible for other services:   You may be eligible for home sleep testing using an auto-adjusting sleep breathing machine (Auto PAP) if indicated.   I have forwarded phone note from 05/22/23 to Dr Shlomo and her sleep coordinator.

## 2023-05-26 NOTE — Telephone Encounter (Signed)
**Note De-Identified Zalman Hull Obfuscation** Letter received from [facility] stating that they have denied the pts CPAP Titration as follows:  Notes we received say you have had diagnostic sleep testing that showed sleep apnea. You do not have health problems that support the need for this sleep testing done at a sleep center instead of a home sleep test. Therefore, this testing done at the center is not medically necessary and is not covered under your health plan. You may be eligible for other services:   You may be eligible for home sleep testing using an auto-adjusting sleep breathing machine (Auto PAP) if indicated.  Forwarding this note to Dr Shlomo and her sleep coordinator.

## 2023-05-31 NOTE — Telephone Encounter (Signed)
 Resubmitted PA, it is currently pending review with Tracking #: [id number]

## 2023-06-02 NOTE — Telephone Encounter (Signed)
**Note De-Identified Chanteria Haggard Obfuscation** Status: PENDED [employer] review Tracking #: [id number] Decision ID: [id number]

## 2023-06-16 NOTE — Telephone Encounter (Signed)
**Note De-Identified Armin Yerger Obfuscation** Per the [facility] Provider Portal, they have denied the pts CPAP Titration again but gave no reason why.  I called [employer] and s/w Kayatta who advised me that it was denied for the same reason as before as follows: "Notes we received say you have had diagnostic sleep testing that showed sleep apnea. You do not have health problems that support the need for this sleep testing done at a sleep center instead of a home sleep test. Therefore, this testing done at the center is not medically necessary and is not covered under your health plan. You may be eligible for other services:   You may be eligible for home sleep testing using an auto-adjusting sleep breathing machine (Auto PAP) if indicated."  Shanda Bumps stated that I can appeal this decision by sending [facility] an appeal letter stating why I feel this CPAP Titration should be approved along with clinical notes.  I wrote the appeal letter and gave the following as reasons that this CPAP Titration should be approved:   The reasons I feel that this CPAP Titration prior authorization should be approved:   1. The patient wore a WatchPAT One-Home Sleep Study device on 03/05/2023 that showed she has Severe Sleep Apnea and Nocturnal Hypoxemia.   Please see Sleep Study Report attached.   2. Per Dr Mayford Knife:   She has uncontrolled hypertension, diastolic CHF and morbid obesity all 3 of those should qualify her for a CPAP titration.   Please see phone note attached.    3. Per the results of the patients WatchPAT One-Home Sleep Study, she has severe sleep apnea, and Dr Mayford Knife feels that a CPAP Titration is the most appropriate next step in treating her sleep apnea.     Please re-consider this denial and approve coverage for this CPAP Titration to prevent future medical issues due to untreated sleep apnea and to improve the patient's quality of life.   As advised by Shanda Bumps, I have faxed the pts Itamar-HST results, her Stop Bang score, and the appeal letter to [facility]  at [phone] with the Pts name, DOB, Case #: [id number], the pts [employer] ID #, and provider: Dr Armanda Magic written on the cover letter and at the top of each page. I did receive confirmation that the fax sent successfully.

## 2023-06-21 NOTE — Telephone Encounter (Signed)
**Note De-Identified Priya Matsen Obfuscation** Letter received from the [facility] provider portal stating that they have approved the pts CPAP Titration from 05/31/2023-08/29/2023. Authorization #: [id number]  I called the sleep lab and requested to have this test scheduled prior to 08/29/2023 as that is the last date of this approval. Per Terri, she will call the pt to schedule this CPAP Titration today.

## 2023-06-27 NOTE — Telephone Encounter (Signed)
Patient is scheduled for CPAP titration on 07/19/23.

## 2023-06-30 ENCOUNTER — Ambulatory Visit: Payer: 59 | Admitting: Physician Assistant

## 2023-06-30 NOTE — Progress Notes (Addendum)
 Subjective  Patient ID: Cheyenne [name]  is a 48 y.o. female here in clinic for:  Chief Complaint  Patient presents with  . Hypertension    Taking carvedilol  3.125 mg, losartan  50 mg, spirolactone  25 mg. Has not been taking for about a week now. She is asking for financial assistance to get her medications. She states  she has to move suddenly. Having headaches and checking at home avg 150/120     The following information was reviewed by members of the visit team:   Past Medical History:  Diagnosis Date  . Anemia   . Congestive cardiac failure (CMD)   . Headache      Past Surgical History:  Procedure Laterality Date  . EYE SURGERY Left    Procedure: EYE SURGERY     Family History  Problem Relation Name Age of Onset  . Arthritis Mother    . Anemia Mother    . Gestational hypertension Maternal Aunt    . Diabetes Maternal Aunt       Social History   Socioeconomic History  . Marital status: Single    Spouse name: Not on file  . Number of children: Not on file  . Years of education: Not on file  . Highest education level: Not on file  Occupational History  . Not on file  Tobacco Use  . Smoking status: Every Day    Current packs/day: 0.50    Types: Cigarettes  . Smokeless tobacco: Never  Vaping Use  . Vaping status: Not on file  Substance and Sexual Activity  . Alcohol use: Yes  . Drug use: Never  . Sexual activity: Not on file  Other Topics Concern  . Not on file  Social History Narrative  . Not on file   Social Drivers of Health   Food Insecurity: Medium Risk (06/30/2023)   Food vital sign   . Within the past 12 months, you worried that your food would run out before you got money to buy more: Sometimes true   . Within the past 12 months, the food you bought just didn't last and you didn't have money to get more: Sometimes true  Transportation Needs: No Transportation Needs (06/30/2023)   Transportation   . In the past 12 months, has lack of reliable  transportation kept you from medical appointments, meetings, work or from getting things needed for daily living? : No  Safety: Low Risk  (06/30/2023)   Safety   . How often does anyone, including family and friends, physically hurt you?: Never   . How often does anyone, including family and friends, insult or talk down to you?: Never   . How often does anyone, including family and friends, threaten you with harm?: Never   . How often does anyone, including family and friends, scream or curse at you?: Never  Living Situation: Low Risk  (06/30/2023)   Living Situation   . What is your living situation today?: I have a steady place to live   . Think about the place you live. Do you have problems with any of the following? Choose all that apply:: None/None on this list      Current Outpatient Medications on File Prior to Visit  Medication Sig Dispense Refill  . aspirin -acetaminophen -caffeine  (Excedrin  Extra Strength) 250-250-65 mg tab per tablet Take  by mouth. (Patient not taking: Reported on 06/30/2023)    . atorvastatin  (LIPITOR) 40 mg tablet Take 1 tablet (40 mg total) by mouth daily. 30  tablet 0  . buPROPion  (Wellbutrin  SR) 150 mg 12 hr tablet Take 1 tablet (150 mg total) by mouth daily for 3 days, THEN 1 tablet (150 mg total) 2 (two) times a day. 63 tablet 2  . carvediloL  (COREG ) 3.125 mg tablet Take 3.125 mg by mouth in the morning and 3.125 mg in the evening. Take with meals. (Patient not taking: Reported on 06/30/2023)    . empagliflozin  (JARDIANCE ) 10 mg tab Take 1 tablet (10 mg total) by mouth daily. (Patient not taking: Reported on 06/30/2023) 30 tablet 0  . ferrous sulfate  (Iron, ferrous sulfate ,) 325 mg (65 mg iron) tablet Take 1 tablet (325 mg total) by mouth daily before breakfast. (Patient not taking: Reported on 06/30/2023) 90 tablet 3  . ferrous sulfate  325 mg (65 mg iron) EC tablet Take 325 mg by mouth daily with breakfast. (Patient not taking: Reported on 06/30/2023)    . glycerin (adult)  suppository Insert 1 suppository into the rectum 2 (two) times a day as needed for constipation. (Patient not taking: Reported on 06/30/2023) 12 suppository 0  . losartan  (COZAAR ) 50 mg tablet Take 1 tablet (50 mg total) by mouth daily. (Patient not taking: Reported on 06/30/2023) 30 tablet 0  . meloxicam (MOBIC) 15 mg tablet Take 15 mg by mouth Once Daily for 14 days. 14 tablet 0  . semaglutide , weight loss, (WEGOVY ) 0.5 mg/0.5 mL injection pen Inject 0.5 mL (0.5 mg total) under the skin every 7 days. (Patient not taking: Reported on 06/30/2023) 2 mL 3  . spironolactone  (ALDACTONE ) 25 mg tablet Take 1 tablet (25 mg total) by mouth daily. (Patient not taking: Reported on 06/30/2023) 30 tablet 0   No current facility-administered medications on file prior to visit.     Patient has no known allergies.   Most recent PHQ-2 results: Patient Health Questionnaire-2 Score: 0 (06/30/2023  9:58 AM)  Most recent PHQ-9 result: Patient Health Questionnaire-9 Score: 4 (06/30/2023  9:58 AM)  PHQ-9 Question # 9 Thoughts that you would be better off dead or hurting yourself in some way: Not at all (06/30/2023  9:58 AM)  Interpretation: PHQ-2 Interpretation: Negative (None-minimal Depression Severity) (06/30/2023  9:58 AM)  PHQ-9 Interpretation: Negative (None-minimal Depression Severity) (06/30/2023  9:58 AM)  Depression Plan: Normal/Negative Screening    No results found for this or any previous visit (from the past 12 weeks).   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  Hypertension This is a chronic problem. The problem is unchanged. The problem is uncontrolled. Associated symptoms include headaches. Pertinent negatives include no anxiety, blurred vision, chest pain, malaise/fatigue,  neck pain, orthopnea, palpitations, peripheral edema, PND, shortness of breath or sweats. Risk factors for coronary artery disease include obesity, smoking/tobacco exposure and sedentary lifestyle. Treatments tried: olmesartan  40 mg, amlodipine  5 mg daily, and spironolactone  25 mg daily. Compliance problems include medication cost (Has not taken this medication in over 1 week as she had to use her money for moving expenses.  Requested financial assistance regarding getting her medicines today.).   Of note, patient also requested to have a referral to a cardiologist within [facility] for further management of her cardiac conditions.  Review of Systems  Constitutional:  Negative for malaise/fatigue.  Eyes:  Negative for blurred vision.  Respiratory:  Negative for shortness of breath.   Cardiovascular:  Negative for chest pain, palpitations, orthopnea and PND.  Musculoskeletal:  Negative for neck pain.  Neurological:  Positive for headaches.     Objective  BP 254/121 (BP Location: Right arm, Patient Position: Sitting)   Pulse 94   Ht 1.676 m (5' 6)   Wt 107 kg (235 lb 14.4 oz)   BMI 38.08 kg/m    Physical Exam Vitals and nursing note reviewed.  Constitutional:      Appearance: Normal appearance.  HENT:     Head: Normocephalic and atraumatic.     Mouth/Throat:     Mouth: Mucous membranes are moist.  Eyes:     Pupils: Pupils are equal, round, and reactive to light.  Cardiovascular:     Rate and Rhythm: Normal rate and regular rhythm.     Pulses: Normal pulses.     Heart sounds: Normal heart sounds.  Pulmonary:     Effort: Pulmonary effort is normal.     Breath sounds: Normal breath sounds.  Abdominal:     General: Abdomen is flat.     Tenderness: There is no abdominal tenderness.  Musculoskeletal:     Cervical back: Neck supple.  Skin:    General: Skin is warm and dry.  Neurological:     General: No focal deficit present.     Mental Status: She is alert and oriented to  person, place, and time.  Psychiatric:        Mood and Affect: Mood normal.        Behavior: Behavior normal.     Assessment/Plan  Diagnoses and all orders for this visit:  Primary hypertension HTN is uncontrolled due to lack of meds.  Patient was referred to pharmacy so that an arrangement could be made for her to obtain all her medicines  including HTN meds (Olmesartan  40 mg, amlodipine  5 mg, spironolactone  25 mg); she reported that she did not need any refills today as all of these have already been sent by her cardiologist.    - Ambulatory referral to Medication Management Services  Financial difficulties -     AH E- consult to Pharmacy placed -Patient would like to be able to afford the following meds: atorvastatin  40 mg, jardiance  10 mg,  Olmesartan  40 mg, amlodipine  5 mg, spironolactone  25 mg and Iron sulfate 325 mg daily.  Chronic diastolic CHF (congestive heart failure) (CMD) -     Ambulatory referral to Cardiology; Future  Electronically signed by: Elveria Tedi Kaiser, MD 06/30/2023 10:03 AM

## 2023-07-19 ENCOUNTER — Ambulatory Visit (HOSPITAL_BASED_OUTPATIENT_CLINIC_OR_DEPARTMENT_OTHER): Payer: 59 | Attending: Cardiology | Admitting: Cardiology

## 2023-07-19 VITALS — Ht 66.0 in | Wt 235.0 lb

## 2023-07-19 DIAGNOSIS — G4733 Obstructive sleep apnea (adult) (pediatric): Secondary | ICD-10-CM | POA: Diagnosis present

## 2023-07-19 DIAGNOSIS — I110 Hypertensive heart disease with heart failure: Secondary | ICD-10-CM | POA: Insufficient documentation

## 2023-07-19 DIAGNOSIS — I5032 Chronic diastolic (congestive) heart failure: Secondary | ICD-10-CM | POA: Diagnosis not present

## 2023-08-01 NOTE — Procedures (Signed)
   [facility] [address] [place], [place] [address] Tel: [phone]   Fax: [phone] Titration Interpretation  Patient Name:  Cheyenne [name], Cheyenne [name] Date:  07/19/2023 Referring Physician:  DR. Armanda Magic  Indications for Polysomnography The patient is a 48 year-old Female who is 5\' 6"  and weighs 235.0 lbs. Her BMI equals 38.2.  A full night titration treatment study was performed.  Polysomnogram Data A full night polysomnogram recorded the standard physiologic parameters including EEG, EOG, EMG, EKG, nasal and oral airflow.  Respiratory parameters of chest and abdominal movements were recorded with Respiratory Inductance Plethysmography belts.  Oxygen saturation was recorded by pulse oximetry.   Sleep Architecture The total recording time of the polysomnogram was 423.5 minutes.  The total sleep time was 386.0 minutes.  The patient spent 3.6% of total sleep time in Stage N1, 66.2% in Stage N2, 0.0% in Stages N3, and 30.2% in REM.  Sleep latency was 15.9 minutes.  REM latency was 60.5 minutes.  Sleep Efficiency was 91.1%.  Wake after Sleep Onset time was 21.0 minutes.  Titration Summary The patient was titrated at pressures ranging from 7* cm/H20 up to 15* cm/H20. The last pressure used in the study was 15* cm/H2O.  Respiratory Events The polysomnogram revealed a presence of 0 obstructive, 1 central, and 0 mixed apneas resulting in an Apnea index of 0.2 events per hour.  There were 27 hypopneas (>=3% desaturation and/or arousal) resulting in an Apnea\Hypopnea Index (AHI >=3% desaturation and/or arousal) of 4.4 events per hour.  There were 6 hypopneas (>=4% desaturation) resulting in an Apnea\Hypopnea Index (AHI >=4% desaturation) of 1.1 events per hour.  There were 2 Respiratory Effort Related Arousals resulting in a RERA index of 0.3 events per hour. The Respiratory Disturbance Index is 4.7 events per hour.  The snore index was 173.5 events per  hour.  Mean oxygen saturation was 94.2%.  The lowest oxygen saturation during sleep was 88.0%.  Time spent <=88% oxygen saturation was 0.1 minutes (0%).  Limb Activity There were 9 limb movements recorded.  Of this total, 6 were classified as PLMs.  Of the PLMs, 0 were associated with arousals.  The Limb Movement index was 1.4 per hour while the PLM index was 0.9 per hour.  Cardiac Summary The average pulse rate was 77.1 bpm.  The minimum pulse rate was 61.0 bpm while the maximum pulse rate was 100.0 bpm.  Cardiac rhythm was normal.  Diagnosis:  Obstructive Sleep Apnea  Recommendations: Recommend a trial of ResMed CPAP at 15cm H2O with heated humidity and medium ResMed Airfit F40 full face mask.  The patient should be encouraged to avoid sleep supine and practice good sleep hygiene. The patient should avoid driving when sleepy. Followup in Sleep Clinic in 6 weeks.  This study was personally reviewed and electronically signed by: DR. Armanda Magic Accredited Board Certified in Sleep Medicine Date/Time:

## 2023-08-02 ENCOUNTER — Encounter (HOSPITAL_BASED_OUTPATIENT_CLINIC_OR_DEPARTMENT_OTHER): Payer: Self-pay | Admitting: Internal Medicine

## 2023-09-25 IMAGING — MR MR KNEE*R* W/O CM
7 series · 40 of 40 positions shown · non-contrast
Comparison: None Available.

CLINICAL DATA: Right knee pain since twisted her knee on October 30, 2021.

EXAM:
MRI OF THE RIGHT KNEE WITHOUT CONTRAST
TECHNIQUE: Multiplanar, multisequence MR imaging of the right was performed. No
intravenous contrast was administered.

[Series 6: T2 fat-sat · axial · right · 4.0mm · 0.50mm/px · z∈[-98,+53]mm · 6 of 36 slices shown (1 of 3)]
[im 1/36]
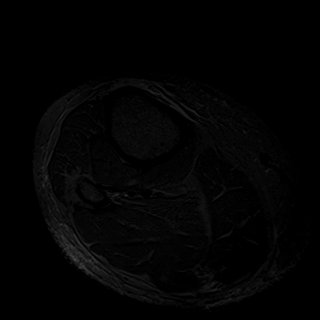
[im 8/36]
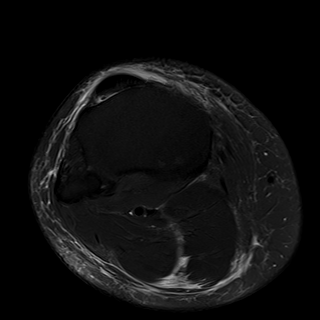
[im 15/36]
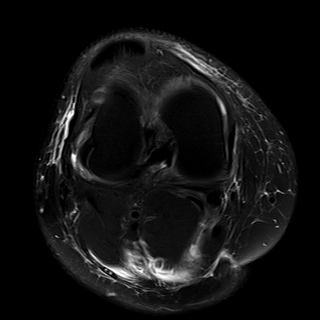
[im 22/36]
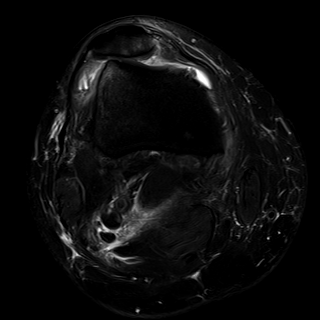
[im 29/36]
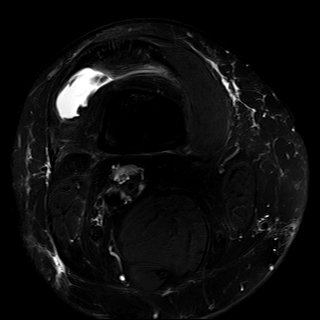
[im 36/36]
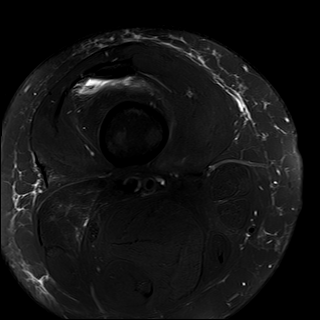

[Series 7: T2 fat-sat · coronal · right · 4.0mm · 0.47mm/px · 6 of 28 slices shown (2 of 3)]
[im 1/28]
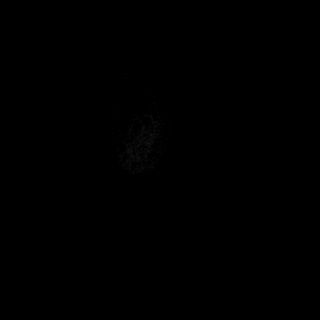
[im 6/28]
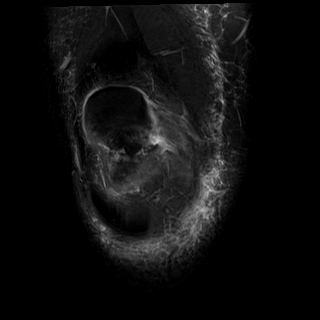
[im 11/28]
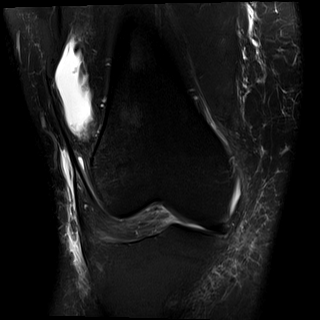
[im 17/28]
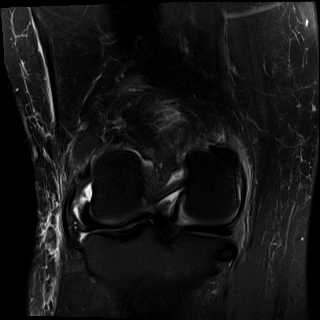
[im 22/28]
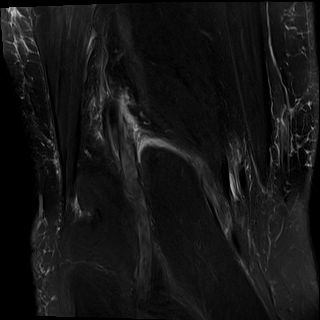
[im 28/28]
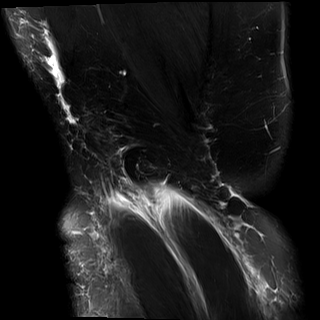

[Series 8: T1 · coronal · right · 4.0mm · 0.47mm/px · 6 of 28 slices shown]
[im 1/28]
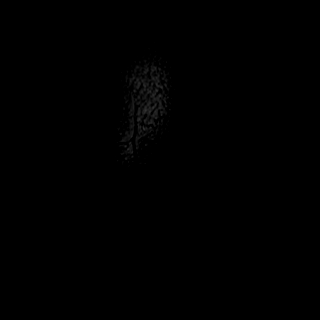
[im 6/28]
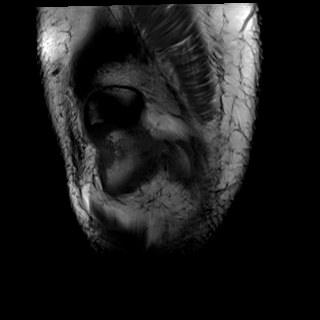
[im 11/28]
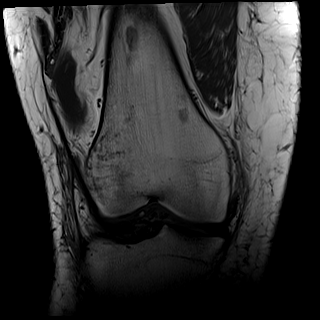
[im 17/28]
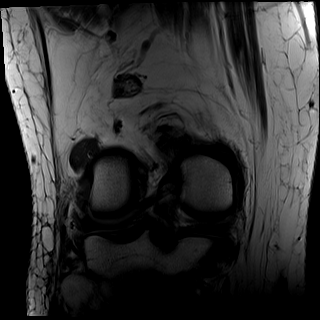
[im 22/28]
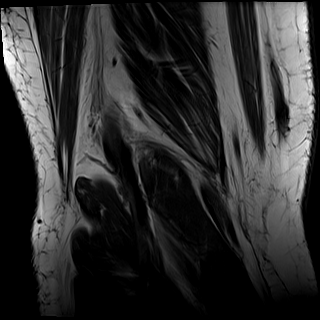
[im 28/28]
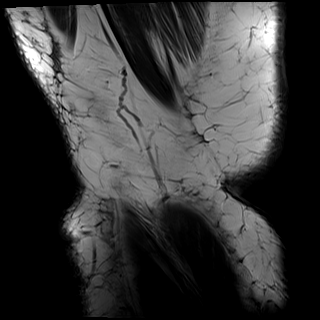

[Series 9: PD fat-sat · coronal · right · 3.0mm · 0.47mm/px · 6 of 28 slices shown (1 of 2)]
[im 1/28]
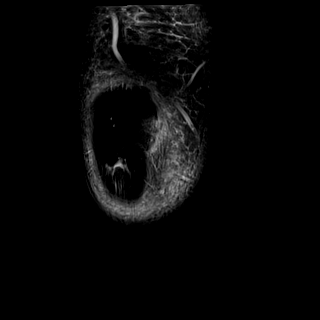
[im 6/28]
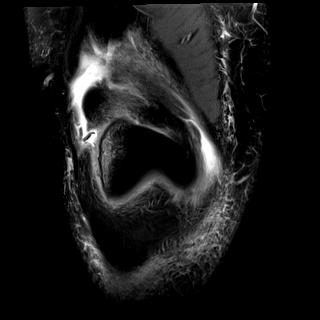
[im 11/28]
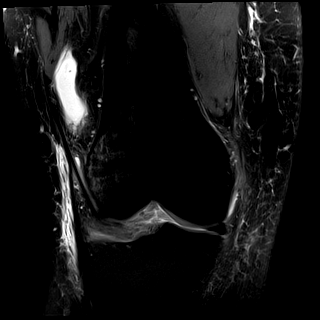
[im 17/28]
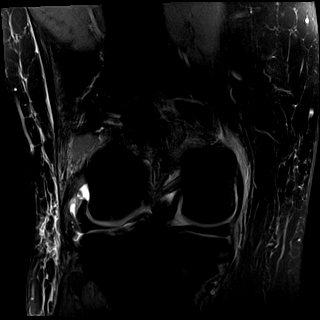
[im 22/28]
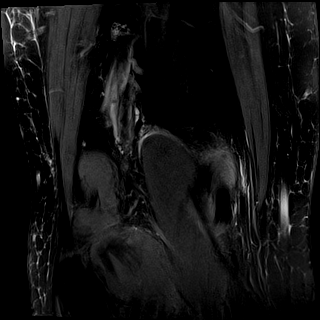
[im 28/28]
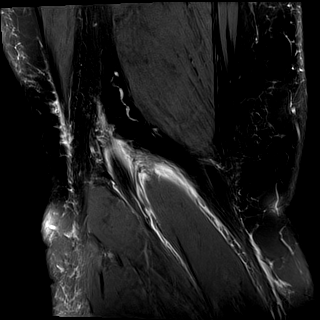

[Series 10: PD fat-sat · sagittal · right · 3.0mm · 0.39mm/px · 6 of 27 slices shown (2 of 2)]
[im 1/27]
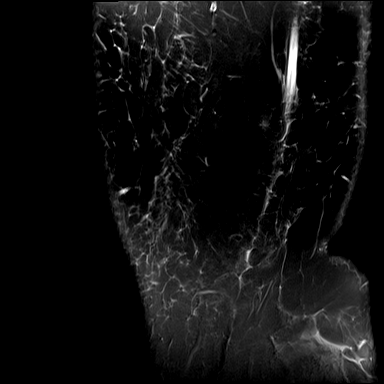
[im 6/27]
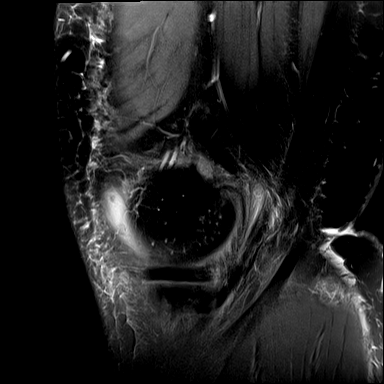
[im 11/27]
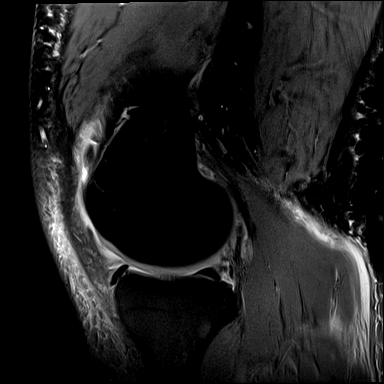
[im 16/27]
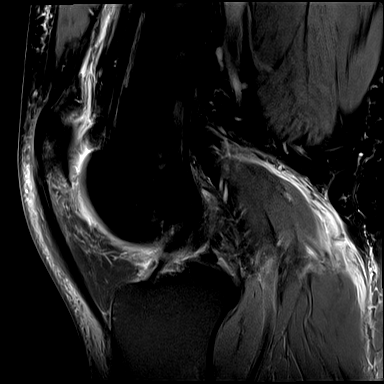
[im 21/27]
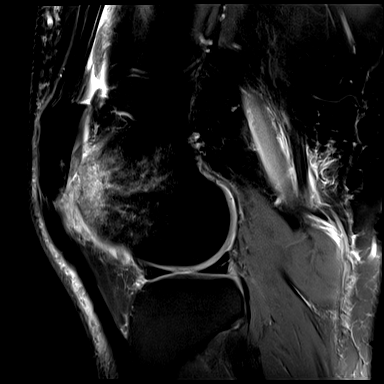
[im 27/27]
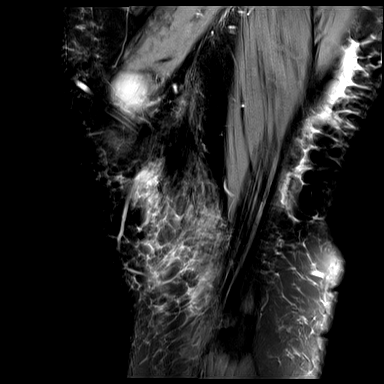

[Series 11: T2 fat-sat · sagittal · right · 3.0mm · 0.39mm/px · 6 of 27 slices shown (3 of 3)]
[im 1/27]
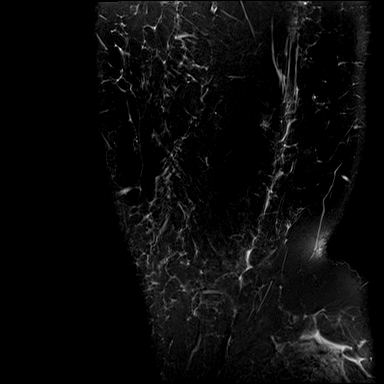
[im 6/27]
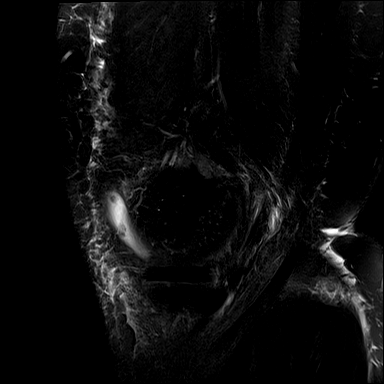
[im 11/27]
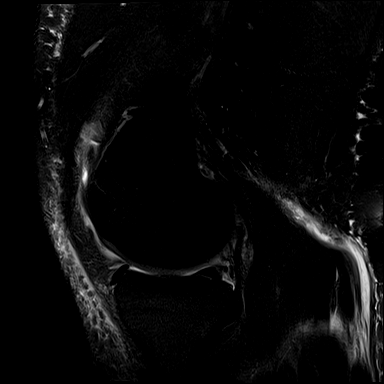
[im 16/27]
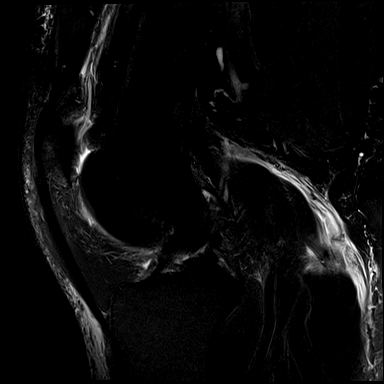
[im 21/27]
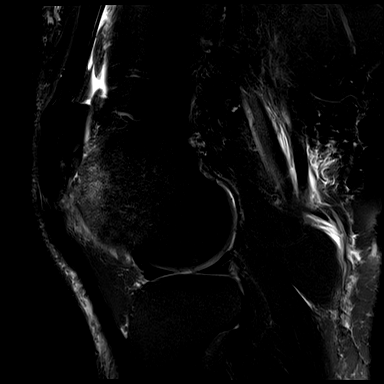
[im 27/27]
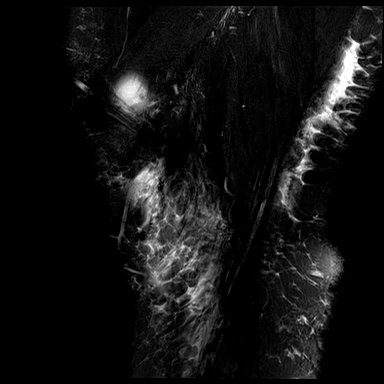

[Series 12: PD · coronal · right · 1.5mm · 0.44mm/px · 4 of 21 slices shown]
[im 1/21]
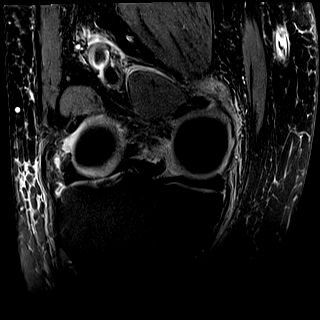
[im 7/21]
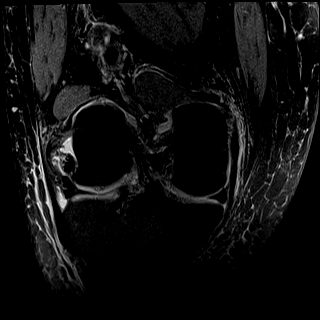
[im 14/21]
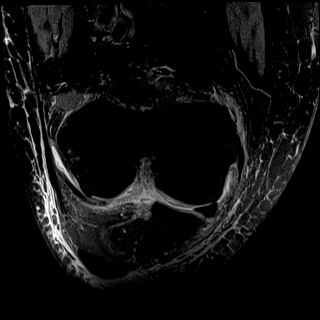
[im 21/21]
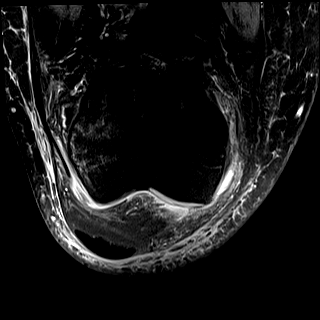

[40 of 40 positions shown; findings below may reference images not displayed]

FINDINGS: MENISCI

Medial: Intact.

Lateral: Horizontal oblique undersurface tear of the body/posterior
horn of the lateral meniscus.

LIGAMENTS

Cruciates: ACL and PCL are intact.

Collaterals: Medial collateral ligament is intact. Lateral
collateral ligament complex is intact.

CARTILAGE

Patellofemoral:  No chondral defect.

Medial:  No chondral defect.

Lateral:  No chondral defect.

JOINT: Large joint effusion. Normal Sungryul Mi. Mild synovial
thickening concerning for synovitis.

POPLITEAL FOSSA: Popliteus tendon is intact. No Baker's cyst.

EXTENSOR MECHANISM: Intact quadriceps tendon. Intact patellar
tendon. Intact lateral patellar retinaculum. Edema along the medial
patellofemoral ligament concerning for full-thickness tear.

BONES: Bone marrow edema of the anterolateral aspect of the lateral
femoral condyle as well as medial aspect of the patella. (Series 6
image 16). No evidence of displaced fracture.

Other: Soft tissue edema about the anterior and posterior aspect of
the knee. Vimzy fluid collection or hematoma. Muscles are normal.
IMPRESSION: 1. Bone contusions of the anterolateral aspect of the lateral
femoral condyle and medial aspect of the patella with irregularity
of the medial patellofemoral ligament concerning for transient
patellar dislocation/relocation.

2.  Large suprapatellar knee joint effusion with synovitis.

3. Horizontal oblique undersurface tear of the body/posterior horn
of the lateral meniscus.

4.  Cruciate and collateral ligaments are intact.

No evidence of displaced fracture.

## 2024-01-06 ENCOUNTER — Emergency Department (HOSPITAL_COMMUNITY): Admission: EM | Admit: 2024-01-06 | Discharge: 2024-01-06 | Discharge status: Against Medical Advice

## 2024-01-06 ENCOUNTER — Emergency Department (HOSPITAL_COMMUNITY)

## 2024-01-06 ENCOUNTER — Encounter (HOSPITAL_COMMUNITY): Payer: Self-pay | Admitting: Pharmacy Technician

## 2024-01-06 ENCOUNTER — Other Ambulatory Visit: Payer: Self-pay

## 2024-01-06 DIAGNOSIS — I10 Essential (primary) hypertension: Secondary | ICD-10-CM | POA: Diagnosis not present

## 2024-01-06 DIAGNOSIS — Z79899 Other long term (current) drug therapy: Secondary | ICD-10-CM | POA: Diagnosis not present

## 2024-01-06 DIAGNOSIS — Z7984 Long term (current) use of oral hypoglycemic drugs: Secondary | ICD-10-CM | POA: Insufficient documentation

## 2024-01-06 DIAGNOSIS — N939 Abnormal uterine and vaginal bleeding, unspecified: Secondary | ICD-10-CM | POA: Insufficient documentation

## 2024-01-06 DIAGNOSIS — Z5329 Procedure and treatment not carried out because of patient's decision for other reasons: Secondary | ICD-10-CM | POA: Diagnosis not present

## 2024-01-06 LAB — CBC
HCT: 31.1 % — ABNORMAL LOW (ref 36.0–46.0)
Hemoglobin: 10.1 g/dL — ABNORMAL LOW (ref 12.0–15.0)
MCH: 28 pg (ref 26.0–34.0)
MCHC: 32.5 g/dL (ref 30.0–36.0)
MCV: 86.1 fL (ref 80.0–100.0)
Platelets: 398 K/uL (ref 150–400)
RBC: 3.61 MIL/uL — ABNORMAL LOW (ref 3.87–5.11)
RDW: 15.8 % — ABNORMAL HIGH (ref 11.5–15.5)
WBC: 8.6 K/uL (ref 4.0–10.5)
nRBC: 0 % (ref 0.0–0.2)

## 2024-01-06 LAB — HCG, SERUM, QUALITATIVE: Preg, Serum: NEGATIVE

## 2024-01-06 LAB — BASIC METABOLIC PANEL WITH GFR
Anion gap: 9 (ref 5–15)
BUN: 10 mg/dL (ref 6–20)
CO2: 24 mmol/L (ref 22–32)
Calcium: 8.4 mg/dL — ABNORMAL LOW (ref 8.9–10.3)
Chloride: 105 mmol/L (ref 98–111)
Creatinine, Ser: 0.74 mg/dL (ref 0.44–1.00)
GFR, Estimated: >60 mL/min (ref >60)
Glucose, Bld: 94 mg/dL (ref 70–99)
Potassium: 3.8 mmol/L (ref 3.5–5.1)
Sodium: 138 mmol/L (ref 135–145)

## 2024-01-06 MED ORDER — IBUPROFEN 800 MG PO TABS
800.0000 mg | ORAL_TABLET | Freq: Once | ORAL | Status: DC
  Filled 2024-01-06: qty 1

## 2024-01-06 MED ORDER — MEGESTROL ACETATE 40 MG PO TABS
40.0000 mg | ORAL_TABLET | Freq: Every day | ORAL | Status: DC
  Administered 2024-01-06: 40 mg via ORAL
  Filled 2024-01-06: qty 1

## 2024-01-06 MED ORDER — SODIUM CHLORIDE 0.9 % IV BOLUS
1000.0000 mL | Freq: Once | INTRAVENOUS | Status: AC
  Administered 2024-01-06: 1000 mL via INTRAVENOUS

## 2024-01-06 NOTE — ED Notes (Signed)
 Pt upset about not getting a reason as to why her vaginal bleeding continues. Pt expressed anger towards repeated blood work. Pt states they keep taking blood work and not telling me what's going on, why I'm bleeding. I've been waiting over 8 hours. Pt refused further blood work and requested removal the her IV. Pt requested her discharge paperwork and a prescription medication of megestrol . Provider Vilinda MARLA [place]) states she may leave via AMA, but he will not be discharging her until he receives a new hemoglobin/ hemacrit levels. Provider states he will not be providing the prescription. Pt was notified of the providers orders about the blood work and prescription. Pt states I'm not signing no AMA I'm leaving IV was removed.

## 2024-01-06 NOTE — ED Triage Notes (Signed)
 Pt here POV with reports of vaginal bleeding X2 weeks. Reports passing a lot of large clots. Endorses intermittent shob. Talked to OBGYN who could not get patient in for an US  and pt was advised to come the the ED for evaluation. Pt tachycardic in triage.

## 2024-01-06 NOTE — ED Notes (Signed)
 Save LT Green in main lab

## 2024-01-06 NOTE — ED Provider Notes (Signed)
 [place] EMERGENCY DEPARTMENT AT [facility] Provider Note   CSN: [id number] Arrival date & time: 01/06/24  9160     Patient presents with: Vaginal Bleeding  HPI DOMINI VANDEHEI is a 48 y.o. female with hypertension, hyperlipidemia iron deficient anemia presenting for vaginal bleeding.  Started 2 weeks ago.  States that the last couple days she is partially passing large clots.  Denies abdominal or pelvic pain at this time.    Vaginal Bleeding      Prior to Admission medications   Medication Sig Start Date End Date Taking? Authorizing Provider  amLODipine  (NORVASC ) 5 MG tablet Take 1 tablet (5 mg total) by mouth daily. 04/07/23   Dunn, Dayna N, PA-C  atorvastatin  (LIPITOR) 40 MG tablet Take 1 tablet (40 mg total) by mouth daily. 12/08/22   Dunn, Dayna N, PA-C  empagliflozin  (JARDIANCE ) 10 MG TABS tablet Take 1 tablet (10 mg total) by mouth daily. 12/08/22   Dunn, Dayna N, PA-C  ferrous sulfate  325 (65 FE) MG EC tablet Take 1 tablet (325 mg total) by mouth daily with breakfast. 11/15/22 11/15/23  Fairy Frames, MD  nicotine  (NICODERM CQ  - DOSED IN MG/24 HOURS) 14 mg/24hr patch Place 1 patch (14 mg total) onto the skin daily. 11/16/22   Fairy Frames, MD  olmesartan  (BENICAR ) 40 MG tablet Take 1 tablet (40 mg total) by mouth daily. 05/01/23   Hilty, Vinie JAYSON, MD  Semaglutide -Weight Management 0.25 MG/0.5ML SOAJ Inject into the skin as directed. Patient not taking: Reported on 03/30/2023 12/02/22   [provider]  spironolactone  (ALDACTONE ) 25 MG tablet Take 1 tablet (25 mg total) by mouth daily. 12/08/22   Dunn, Dayna N, PA-C  Varenicline  Tartrate, Starter, 0.5 MG X 11 & 1 MG X 42 TBPK as directed. 12/02/22   [provider]  albuterol  (PROVENTIL  HFA;VENTOLIN  HFA) 108 (90 Base) MCG/ACT inhaler Inhale 2 puffs into the lungs every 6 (six) hours as needed for wheezing or shortness of breath. Patient not taking: Reported on 09/07/2019 05/12/17 09/07/19  Jason Leita Repine, FNP    Allergies: Patient has no known allergies.    Review of Systems  Genitourinary:  Positive for vaginal bleeding.    Updated Vital Signs BP 124/89 (BP Location: Right Arm)   Pulse 98   Temp 98 F (36.7 C) (Oral)   Resp 18   SpO2 100%   Physical Exam Vitals and nursing note reviewed. Exam conducted with a chaperone present.  HENT:     Head: Normocephalic and atraumatic.     Mouth/Throat:     Mouth: Mucous membranes are moist.  Eyes:     General:        Right eye: No discharge.        Left eye: No discharge.     Conjunctiva/sclera: Conjunctivae normal.  Cardiovascular:     Rate and Rhythm: Normal rate and regular rhythm.     Pulses: Normal pulses.     Heart sounds: Normal heart sounds.  Pulmonary:     Effort: Pulmonary effort is normal.     Breath sounds: Normal breath sounds.  Abdominal:     General: Abdomen is flat.     Palpations: Abdomen is soft.     Tenderness: There is no abdominal tenderness.  Genitourinary:    Vagina: No signs of injury and foreign body. No erythema.     Cervix: Normal.     Comments: Maroon-colored blood noted in the vaginal vault no notable active bleeding noted.  Skin:    General: Skin is warm and dry.  Neurological:     General: No focal deficit present.  Psychiatric:        Mood and Affect: Mood normal.     (all labs ordered are listed, but only abnormal results are displayed) Labs Reviewed  CBC - Abnormal; Notable for the following components:      Result Value   RBC 3.61 (*)    Hemoglobin 10.1 (*)    HCT 31.1 (*)    RDW 15.8 (*)    All other components within normal limits  BASIC METABOLIC PANEL WITH GFR - Abnormal; Notable for the following components:   Calcium  8.4 (*)    All other components within normal limits  HCG, SERUM, QUALITATIVE  HEMOGLOBIN AND HEMATOCRIT, BLOOD    EKG: None  Radiology: No results found.   Procedures   Medications Ordered in the ED  megestrol  (MEGACE ) tablet 40 mg  (40 mg Oral Given 01/06/24 1503)  sodium chloride  0.9 % bolus 1,000 mL (0 mLs Intravenous Stopped 01/06/24 1539)                                    Medical Decision Making Amount and/or Complexity of Data Reviewed Labs: ordered. Radiology: ordered.  Risk Prescription drug management.   48 year old well-appearing female presenting for vaginal bleeding.  Exam notable for tachycardia and a small amount of maroon-colored blood in the vaginal vault but otherwise reassuring.  Heart rate did improve with fluids.  Workup does not suggest infection or sepsis.  Negative pregnancy test.  CBC revealed hemoglobin of 10.1 which is down from 11.5 no signs of symptomatic anemia however.  Gave 1 L of fluids and her heart rate did improve.  Had hoped to recheck her hemoglobin again before discharge but she left AMA.  She did give 1 tablet of Megace .     Final diagnoses:  Vaginal bleeding    ED Discharge Orders     None          Lang Norleen POUR, PA-C 01/06/24 [address], Megan L, DO 01/08/24 2114

## 2024-02-14 NOTE — Progress Notes (Signed)
 Patient came into clinic to have labs drawn. Patient voiced no questions or concerns.

## 2024-02-28 ENCOUNTER — Encounter (HOSPITAL_COMMUNITY): Payer: Self-pay | Admitting: Obstetrics and Gynecology

## 2024-02-28 NOTE — Progress Notes (Addendum)
 Addendum:  Called and spoke w/ patient about new surgery start time @ 1207.  Pt verbalized understanding to arrive at 1000 , npo after midnight with exception clear liquids up until 0900.  Spoke w/ via phone for pre-op interview--- pt Lab needs dos----   upt      Lab results------ lab appt 03-05-2024 @ 1130 getting CBC/ CMP/ T&S Current EKG in epic / chart dated 01-06-2024 COVID test -----patient states asymptomatic no test needed Arrive at ------- 0815 on 03-11-2024 NPO after MN NO Solid Food.  Clear liquids from MN until--- 0715 Pre-Surgery Ensure or G2: n/a  Med rec completed Medications to take morning of surgery ----- norvasc  Diabetic medication ----- n/a  GLP1 agonist last dose: n/a GLP1 instructions:  Patient instructed no nail polish to be worn day of surgery Patient instructed to bring photo id and insurance card day of surgery Patient aware to have Driver (ride ) / caregiver    for 24 hours after surgery - daughter, lear simmons Patient Special Instructions ----- will pick up soap and written instructions at lab appt Pre-Op special Instructions ----- n/a  Patient verbalized understanding of instructions that were given at this phone interview. Patient denies chest pain, sob, fever, cough at the interview.    Anesthesia Review: HTN;  chronic diastolic CHF preserved ef;  chronic ST;  OSA w/ CPAP (pt stated uses nightly);    PCP: Dr C. Allman graciela 02-13-2024) Cardiologist : Dr GORMAN. Rose (lov 09-27-2023) OSA:  Dr T.Turner  Chest x-ray : 02-13-2024 [facility] EKG : 01-06-2024 epic Echo : 02-28-2023 [facility] Stress test: [facility] 02-17-2023 epic Cardiac Cath :  no  Activity level:  pt denies sob w/ any activity Sleep Study/ CPAP : yes;  HST 03-05-2023 in epic (severe) titrated 07-19-2023 Fasting Blood Sugar :      / Checks Blood Sugar -- times a day:  n/a  Blood Thinner/ Instructions /Last Dose: no ASA / Instructions/ Last Dose : no

## 2024-02-28 NOTE — Pre-Procedure Instructions (Signed)
 Surgical Instructions  Your procedure is scheduled on :  Monday,  03-11-2024 Report to [facility] Main Entrance A at 8:15 AM, then check in the Admitting office. Any questions or running late day of surgery :  call [phone]  Questions prior to your surgery day:  call [phone], Monday -- Friday 8am - 4pm. If you experience any cold or flu symptoms such as cough, fever, chills, shortness of breath, etc. between now and you scheduled surgery, please notify your surgeon office.   Remember: Do Not eat any food after midnight the night before surgery. You may have clear liquids from  Midnight night before surgery until 7:15 AM.   Clear liquids allowed are:  Water             Carbonated Beverages (diabetics choose diet or no sugar options)  Clear Tea ( no milk, no honey, etc.)  Black coffee ( NO MILK, CREAM OR POWDERED CREAMER OF ANY KIND)  Sport drinks, like Gatorade (diabetes choose diet or no sugar options)  NO clear liquid after 7:15 AM day of surgery.  This includes no water,  candy,  gum, and mints.  Take these medicines the morning of surgery with A SIPS OF WATER:   Amlodipine  (norvasc )   May take these medicines IF NEEDED:  NONE   One week prior to surgery, STOP taking any Aspirin  (unless otherwise instructed by your surgeon) Aleve , Naproxen , ibuprofen , Motrin , Advil , Goody's, BC's, all herbal medications/ supplements, fish oil, and non-prescription vitamins.  Do NOT Smoke (tobacco/ vaping) and Do Not drink alcohol for 24 hours prior to your procedure.  ~~For those patients that use a CPAP.  Please bring your CPAP/ mask/ tubing with them day of surgery. Anesthesia may have recovery nurse use if needed and if you stay the night you will be asked to use it.  You will be asked to removed any contacts, glasses, piercing's, hearing aid's, dentures/ partials prior to surgery.  Please bring cases/ container/ solution/ etc., for them day of surgery.   Patients discharged the day  of surgery will NOT be allowed to drive home.  You must have responsible driver and caregiver to stay at home with you the next 24 hours.  SURGICAL WAITING ROOM VISITATION Patients may have no more than 2 support people in the waiting area - if more than 2 , these visitors may rotate.  Pre-op nurse will coordinate an appropriate time for 1 Adult support person, who may not rotate, to accompany patient in pre-op.  Aware some patients may have certain circumstances, speak to pre-op nurse day of surgery.  Children under the age 48 must have an adult with them who is not the patient and must remain in the main waiting area with an adult.  If the patient needs to stay at the hospital during part of their recovery, the visitor guidelines for inpatient rooms apply.  Please refer to the [facility] website for the visitor guidelines for any additional information.  If you received a COVID test during your pre-op visit it is requested that you wear a mask when out in public, stay away from anyone that may not be feeling well and notify your surgeon if you develop symptoms.  If you have been in contact with anyone that has tested positive in the past 10 days notify your surgeon.     [place] - Preparing for Surgery  Before surgery, you can play an important role. Because skin is not sterile, it needs to  be as free of germs as possible. You can reduce the number of germs on your skin by washing with CHG (chlorhexidine gluconate) soap before surgery. CHG is an antiseptic cleaner which kills germs and bonds with the skin to continue killing germs even after washing. Oral hygiene is also important in reducing the risk of infection. Remember to brush your teeth with your regular toothpaste the morning of surgery.  Please DO NOT use if you have an allergy to CHG or antibacterial soaps. If your skin becomes reddened/irritated stop using the CHG and inform your Pre-op nurse Day of surgery.  DO NOT shave  (including legs and genital area) for at least 48 hours prior to your CHG shower.   Please follow these instructions carefully:  Shower with CHG soap the night before surgery. If you choose to wash your hair, wash your hair first as usual with your normal shampoo. After you shampoo, rinse your hair and body thoroughly to remove the shampoo. Use CHG as you would any other liquid soap. You can apply CHG directly to the skin and wash gently with a clean washcloth or shower sponge. Apply the CHG soap to your body ONLY FROM THE NECK DOWN. Do not use on open wounds or open sores. Avoid contact with your eyes, ears, mouth, and genitals (private parts). Wash genitals (private parts) with your normal soap. Wash thoroughly, paying special attention to the area where your surgery will be performed. Thoroughly rinse your body with warm water from the neck down. DO NOT shower/wash with your normal soap after using and rinsing off the CHG soap. DO NOT use lotions, oils, etc., after showering with CHG. Pat yourself dry with a clean towel. Wear clean pajamas. Place clean sheets on your bed the night of your CHG shower and do not sleep with pets.  Day of Surgery  DO NOT Apply any lotions,  powder,  oils,  deodorants (may use underarm deodorant),  cologne/  perfumes  or makeup Do Not wear jewelry /  piercing's/  metal/  permanent jewelry must be removed prior to arrival day of surgery. (No plastic piercing) Do Not wear nail polish,  gel polish,  artificial nails, or any other type of covering on natural finger nails (toe nails are okay) Remember to brush your teeth and rinse mouth out. Put on clean / comfortable clothes. [place] is not responsible for valuables/ personal belongings

## 2024-03-05 ENCOUNTER — Encounter (HOSPITAL_COMMUNITY)
Admission: RE | Admit: 2024-03-05 | Discharge: 2024-03-05 | Disposition: A | Discharge status: Home / Self Care | Source: Ambulatory Visit | Attending: Obstetrics and Gynecology | Admitting: Obstetrics and Gynecology

## 2024-03-05 DIAGNOSIS — Z01812 Encounter for preprocedural laboratory examination: Secondary | ICD-10-CM | POA: Insufficient documentation

## 2024-03-05 DIAGNOSIS — N939 Abnormal uterine and vaginal bleeding, unspecified: Secondary | ICD-10-CM | POA: Diagnosis not present

## 2024-03-05 LAB — TYPE AND SCREEN
ABO/RH(D): O POS
Antibody Screen: NEGATIVE

## 2024-03-05 LAB — COMPREHENSIVE METABOLIC PANEL WITH GFR
ALT: 24 U/L (ref 0–44)
AST: 22 U/L (ref 15–41)
Albumin: 3.2 g/dL — ABNORMAL LOW (ref 3.5–5.0)
Alkaline Phosphatase: 47 U/L (ref 38–126)
Anion gap: 10 (ref 5–15)
BUN: <5 mg/dL — ABNORMAL LOW (ref 6–20)
CO2: 25 mmol/L (ref 22–32)
Calcium: 8.9 mg/dL (ref 8.9–10.3)
Chloride: 100 mmol/L (ref 98–111)
Creatinine, Ser: 0.89 mg/dL (ref 0.44–1.00)
GFR, Estimated: >60 mL/min (ref >60)
Glucose, Bld: 83 mg/dL (ref 70–99)
Potassium: 3.7 mmol/L (ref 3.5–5.1)
Sodium: 135 mmol/L (ref 135–145)
Total Bilirubin: 1 mg/dL (ref 0.0–1.2)
Total Protein: 7.2 g/dL (ref 6.5–8.1)

## 2024-03-05 LAB — CBC
HCT: 51.7 % — ABNORMAL HIGH (ref 36.0–46.0)
Hemoglobin: 16.6 g/dL — ABNORMAL HIGH (ref 12.0–15.0)
MCH: 26.3 pg (ref 26.0–34.0)
MCHC: 32.1 g/dL (ref 30.0–36.0)
MCV: 81.8 fL (ref 80.0–100.0)
Platelets: 407 K/uL — ABNORMAL HIGH (ref 150–400)
RBC: 6.32 MIL/uL — ABNORMAL HIGH (ref 3.87–5.11)
RDW: 14.8 % (ref 11.5–15.5)
WBC: 7.4 K/uL (ref 4.0–10.5)
nRBC: 0 % (ref 0.0–0.2)

## 2024-03-10 NOTE — H&P (Signed)
 Cheyenne [name] is an 48 y.o. female G3P3 with AUB, menorrhagia to anemia, also adenomyosis by Ultrasound.  Pt as medical clearance for surgery and requests definitive management.  D/W pt r/b/a of [employer] Robot assisted total laparoscopic hysterectomy, bilateral salpingectomy and possible cystoscopy, also d/w pt process and expectations.    Pertinent Gynecological History: OB History: G3, P3003 SVD x 3  No abn pap 8/25 unsatis, HR HPV neg - repeat pap No STI S/p BTL No abn pap, last 02/04/24 - BiRads 2, cat B  Due to menorrhagia - on Gallifrey Menstrual History:  No LMP recorded. (Menstrual status: Irregular Periods).    Past Medical History:  Diagnosis Date   Abnormal uterine bleeding (AUB)    Arthritis    knees   Chronic heart failure with preserved ejection fraction (HFpEF) (HCC) 10/2022   cardiologist--- dr s. rose ([facility] in [place]);  first dx 06/ 2024 ED visit in epic w/ SOB/ CP ,  found small pericarial effusion on echo,  last echo in 02-28-2023 no evidence pericardial effusion, ef 60-65%, mild LVH, G1DD, mild LAE   HTN (hypertension)    followed by cardiology and pcp   Hyperlipidemia    Iron deficiency anemia due to chronic blood loss    oral iron   Menorrhagia with irregular cycle    OSA on CPAP 02/2023   (02-28-2024  pt stated uses nightly)  followed by Dr T. 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 Stringer;   PPTL    Family History  Adopted: Yes  Problem Relation Age of Onset   Hypertension Mother    Osteoarthritis Mother    Hypertension Other    Diabetes Other    Colon cancer Neg Hx    Esophageal  cancer Neg Hx    Stomach cancer Neg Hx     Social History:  reports that she has been smoking cigarettes. She has never used smokeless tobacco. She reports current alcohol use. She reports that she does not use drugs. Single, [employer]  Allergies: No Known Allergies  Meds: amlodipine , iron, Gallifrey, irbesartan (oxycodone and ibuprofen )  Review of Systems  Constitutional: Negative.   HENT: Negative.    Respiratory: Negative.    Gastrointestinal: Negative.   Genitourinary:  Positive for menstrual problem.  Musculoskeletal: Negative.   Skin: Negative.   Neurological: Negative.   Psychiatric/Behavioral: Negative.      Height 5' 6 (1.676 m), weight 108.9 kg. Physical Exam Constitutional:      Appearance: Normal appearance.  HENT:     Head: Normocephalic and atraumatic.  Cardiovascular:     Rate and Rhythm: Normal rate and regular rhythm.  Pulmonary:     Effort: Pulmonary effort is normal.     Breath sounds: Normal breath sounds.  Abdominal:     General: Bowel sounds are normal.     Palpations: Abdomen is soft.  Genitourinary:    General: Normal vulva.     Rectum: Normal.  Musculoskeletal:        General: Normal range of motion.  Cervical back: Normal range of motion and neck supple.  Skin:    General: Skin is warm and dry.  Neurological:     General: No focal deficit present.     Mental Status: She is alert and oriented to person, place, and time.  Psychiatric:        Mood and Affect: Mood normal.        Behavior: Behavior normal.    Adenomyosis on US  On aygestin to control AUB   Assessment/Plan: 48yo G3P3 for RA TLH/BS, possible cystoscopy D/w pt r/b/a, process and expectations Plan for d/c DOS Has postop meds (ibuprofen  and oxycodone) Also has postop appointment scheduled  Abraham Margulies Bovard-Stuckert 03/10/2024, 7:57 PM

## 2024-03-11 ENCOUNTER — Other Ambulatory Visit: Payer: Self-pay

## 2024-03-11 ENCOUNTER — Encounter (HOSPITAL_COMMUNITY): Payer: Self-pay | Admitting: Obstetrics and Gynecology

## 2024-03-11 ENCOUNTER — Ambulatory Visit (HOSPITAL_COMMUNITY)
Admission: RE | Admit: 2024-03-11 | Discharge: 2024-03-11 | Disposition: A | Discharge status: Home / Self Care | Attending: Obstetrics and Gynecology | Admitting: Obstetrics and Gynecology

## 2024-03-11 ENCOUNTER — Ambulatory Visit (HOSPITAL_COMMUNITY): Admitting: Anesthesiology

## 2024-03-11 ENCOUNTER — Encounter (HOSPITAL_COMMUNITY)
Admission: RE | Disposition: A | Discharge status: Home / Self Care | Payer: Self-pay | Source: Home / Self Care | Attending: Obstetrics and Gynecology

## 2024-03-11 DIAGNOSIS — F17210 Nicotine dependence, cigarettes, uncomplicated: Secondary | ICD-10-CM | POA: Diagnosis not present

## 2024-03-11 DIAGNOSIS — D259 Leiomyoma of uterus, unspecified: Secondary | ICD-10-CM | POA: Diagnosis not present

## 2024-03-11 DIAGNOSIS — I110 Hypertensive heart disease with heart failure: Secondary | ICD-10-CM | POA: Insufficient documentation

## 2024-03-11 DIAGNOSIS — Z9889 Other specified postprocedural states: Secondary | ICD-10-CM

## 2024-03-11 DIAGNOSIS — E66813 Obesity, class 3: Secondary | ICD-10-CM | POA: Insufficient documentation

## 2024-03-11 DIAGNOSIS — N939 Abnormal uterine and vaginal bleeding, unspecified: Secondary | ICD-10-CM | POA: Diagnosis present

## 2024-03-11 DIAGNOSIS — I5032 Chronic diastolic (congestive) heart failure: Secondary | ICD-10-CM | POA: Insufficient documentation

## 2024-03-11 DIAGNOSIS — Z79899 Other long term (current) drug therapy: Secondary | ICD-10-CM | POA: Insufficient documentation

## 2024-03-11 DIAGNOSIS — N736 Female pelvic peritoneal adhesions (postinfective): Secondary | ICD-10-CM | POA: Insufficient documentation

## 2024-03-11 DIAGNOSIS — D500 Iron deficiency anemia secondary to blood loss (chronic): Secondary | ICD-10-CM | POA: Insufficient documentation

## 2024-03-11 DIAGNOSIS — M1990 Unspecified osteoarthritis, unspecified site: Secondary | ICD-10-CM | POA: Insufficient documentation

## 2024-03-11 DIAGNOSIS — I5033 Acute on chronic diastolic (congestive) heart failure: Secondary | ICD-10-CM

## 2024-03-11 DIAGNOSIS — G4733 Obstructive sleep apnea (adult) (pediatric): Secondary | ICD-10-CM | POA: Diagnosis not present

## 2024-03-11 DIAGNOSIS — N888 Other specified noninflammatory disorders of cervix uteri: Secondary | ICD-10-CM | POA: Insufficient documentation

## 2024-03-11 DIAGNOSIS — N8003 Adenomyosis of the uterus: Secondary | ICD-10-CM | POA: Diagnosis not present

## 2024-03-11 DIAGNOSIS — Z6841 Body Mass Index (BMI) 40.0 and over, adult: Secondary | ICD-10-CM | POA: Diagnosis not present

## 2024-03-11 DIAGNOSIS — N920 Excessive and frequent menstruation with regular cycle: Secondary | ICD-10-CM | POA: Diagnosis not present

## 2024-03-11 DIAGNOSIS — Z01818 Encounter for other preprocedural examination: Secondary | ICD-10-CM

## 2024-03-11 HISTORY — PX: LAPAROSCOPIC LYSIS OF ADHESIONS: SHX5905

## 2024-03-11 HISTORY — DX: Tachycardia, unspecified: R00.0

## 2024-03-11 HISTORY — DX: Excessive and frequent menstruation with irregular cycle: N92.1

## 2024-03-11 HISTORY — PX: CYSTOSCOPY: SHX5120

## 2024-03-11 HISTORY — DX: Unspecified osteoarthritis, unspecified site: M19.90

## 2024-03-11 HISTORY — DX: Iron deficiency anemia secondary to blood loss (chronic): D50.0

## 2024-03-11 HISTORY — PX: ROBOTIC ASSISTED TOTAL HYSTERECTOMY WITH BILATERAL SALPINGO OOPHERECTOMY: SHX6086

## 2024-03-11 HISTORY — DX: Unqualified visual loss, left eye, normal vision right eye: H54.62

## 2024-03-11 HISTORY — DX: Abnormal uterine and vaginal bleeding, unspecified: N93.9

## 2024-03-11 LAB — ABO/RH: ABO/RH(D): O POS

## 2024-03-11 LAB — POCT PREGNANCY, URINE: Preg Test, Ur: NEGATIVE

## 2024-03-11 SURGERY — HYSTERECTOMY, TOTAL, ROBOT-ASSISTED, LAPAROSCOPIC, WITH BILATERAL SALPINGO-OOPHORECTOMY
Anesthesia: General | Site: Bladder

## 2024-03-11 MED ORDER — ONDANSETRON HCL 4 MG/2ML IJ SOLN
INTRAMUSCULAR | Status: AC
Start: 2024-03-11 — End: 2024-03-11
  Filled 2024-03-11: qty 2

## 2024-03-11 MED ORDER — FENTANYL CITRATE (PF) 100 MCG/2ML IJ SOLN: 25.0000 ug | INTRAMUSCULAR | Status: DC | PRN

## 2024-03-11 MED ORDER — OXYCODONE-ACETAMINOPHEN 5-325 MG PO TABS
1.0000 | ORAL_TABLET | ORAL | Status: DC | PRN
  Administered 2024-03-11: 1 via ORAL
  Filled 2024-03-11: qty 1

## 2024-03-11 MED ORDER — CHLORHEXIDINE GLUCONATE 0.12 % MT SOLN
OROMUCOSAL | Status: AC
  Filled 2024-03-11: qty 15

## 2024-03-11 MED ORDER — ALUM & MAG HYDROXIDE-SIMETH 200-200-20 MG/5ML PO SUSP: 30.0000 mL | ORAL | Status: DC | PRN

## 2024-03-11 MED ORDER — ROCURONIUM BROMIDE 10 MG/ML (PF) SYRINGE
PREFILLED_SYRINGE | INTRAVENOUS | Status: DC | PRN
  Administered 2024-03-11: 10 mg via INTRAVENOUS
  Administered 2024-03-11: 60 mg via INTRAVENOUS

## 2024-03-11 MED ORDER — AMLODIPINE BESYLATE 5 MG PO TABS: 10.0000 mg | ORAL_TABLET | Freq: Every day | ORAL | Status: DC

## 2024-03-11 MED ORDER — ONDANSETRON HCL 4 MG PO TABS: 4.0000 mg | ORAL_TABLET | Freq: Four times a day (QID) | ORAL | Status: DC | PRN

## 2024-03-11 MED ORDER — PHENYLEPHRINE HCL (PRESSORS) 10 MG/ML IV SOLN
INTRAVENOUS | Status: AC
Start: 2024-03-11 — End: 2024-03-11
  Filled 2024-03-11: qty 1

## 2024-03-11 MED ORDER — LIDOCAINE 2% (20 MG/ML) 5 ML SYRINGE
INTRAMUSCULAR | Status: AC
  Filled 2024-03-11: qty 5

## 2024-03-11 MED ORDER — GLYCOPYRROLATE PF 0.2 MG/ML IJ SOSY
PREFILLED_SYRINGE | INTRAMUSCULAR | Status: DC | PRN
  Administered 2024-03-11: .1 mg via INTRAVENOUS

## 2024-03-11 MED ORDER — PROPOFOL 10 MG/ML IV BOLUS
INTRAVENOUS | Status: AC
  Filled 2024-03-11: qty 20

## 2024-03-11 MED ORDER — LACTATED RINGERS IV SOLN: INTRAVENOUS | Status: DC

## 2024-03-11 MED ORDER — PROPOFOL 10 MG/ML IV BOLUS
INTRAVENOUS | Status: DC | PRN
  Administered 2024-03-11: 200 mg via INTRAVENOUS

## 2024-03-11 MED ORDER — FENTANYL CITRATE (PF) 250 MCG/5ML IJ SOLN
INTRAMUSCULAR | Status: AC
  Filled 2024-03-11: qty 5

## 2024-03-11 MED ORDER — ORAL CARE MOUTH RINSE: 15.0000 mL | Freq: Once | OROMUCOSAL | Status: AC

## 2024-03-11 MED ORDER — PHENYLEPHRINE HCL (PRESSORS) 10 MG/ML IV SOLN
INTRAVENOUS | Status: AC
  Filled 2024-03-11: qty 1

## 2024-03-11 MED ORDER — GUAIFENESIN 100 MG/5ML PO LIQD: 15.0000 mL | ORAL | Status: DC | PRN

## 2024-03-11 MED ORDER — CHLORHEXIDINE GLUCONATE 0.12 % MT SOLN
15.0000 mL | Freq: Once | OROMUCOSAL | Status: AC
  Administered 2024-03-11: 15 mL via OROMUCOSAL

## 2024-03-11 MED ORDER — SIMETHICONE 80 MG PO CHEW: 80.0000 mg | CHEWABLE_TABLET | Freq: Four times a day (QID) | ORAL | Status: DC | PRN

## 2024-03-11 MED ORDER — IBUPROFEN 800 MG PO TABS
800.0000 mg | ORAL_TABLET | Freq: Three times a day (TID) | ORAL | Status: DC | PRN
Start: 2024-03-11 — End: 2024-03-12
  Administered 2024-03-11: 800 mg via ORAL
  Filled 2024-03-11: qty 1

## 2024-03-11 MED ORDER — 0.9 % SODIUM CHLORIDE (POUR BTL) OPTIME
TOPICAL | Status: DC | PRN
  Administered 2024-03-11: 1000 mL

## 2024-03-11 MED ORDER — HYDROMORPHONE HCL 1 MG/ML IJ SOLN
0.2000 mg | INTRAMUSCULAR | Status: DC | PRN
  Administered 2024-03-11: 0.6 mg via INTRAVENOUS
  Filled 2024-03-11: qty 1

## 2024-03-11 MED ORDER — PHENYLEPHRINE HCL-NACL 20-0.9 MG/250ML-% IV SOLN
INTRAVENOUS | Status: DC | PRN
  Administered 2024-03-11: 25 ug/min via INTRAVENOUS

## 2024-03-11 MED ORDER — ROCURONIUM BROMIDE 10 MG/ML (PF) SYRINGE
PREFILLED_SYRINGE | INTRAVENOUS | Status: AC
  Filled 2024-03-11: qty 10

## 2024-03-11 MED ORDER — CEFAZOLIN SODIUM-DEXTROSE 2-4 GM/100ML-% IV SOLN
INTRAVENOUS | Status: AC
  Filled 2024-03-11: qty 100

## 2024-03-11 MED ORDER — IRBESARTAN 150 MG PO TABS
150.0000 mg | ORAL_TABLET | Freq: Every day | ORAL | Status: DC
  Filled 2024-03-11: qty 1

## 2024-03-11 MED ORDER — ACETAMINOPHEN 500 MG PO TABS
ORAL_TABLET | ORAL | Status: AC
  Filled 2024-03-11: qty 2

## 2024-03-11 MED ORDER — ONDANSETRON HCL 4 MG/2ML IJ SOLN
INTRAMUSCULAR | Status: DC | PRN
  Administered 2024-03-11: 4 mg via INTRAVENOUS

## 2024-03-11 MED ORDER — OXYCODONE HCL 5 MG/5ML PO SOLN: 5.0000 mg | Freq: Once | ORAL | Status: DC | PRN

## 2024-03-11 MED ORDER — OXYCODONE HCL 5 MG PO TABS: 5.0000 mg | ORAL_TABLET | Freq: Once | ORAL | Status: DC | PRN

## 2024-03-11 MED ORDER — ONDANSETRON HCL 4 MG/2ML IJ SOLN: 4.0000 mg | Freq: Four times a day (QID) | INTRAMUSCULAR | Status: DC | PRN

## 2024-03-11 MED ORDER — BUPIVACAINE HCL (PF) 0.25 % IJ SOLN
INTRAMUSCULAR | Status: DC | PRN
  Administered 2024-03-11: 18 mL

## 2024-03-11 MED ORDER — CEFAZOLIN SODIUM-DEXTROSE 2-4 GM/100ML-% IV SOLN
2.0000 g | INTRAVENOUS | Status: AC
  Administered 2024-03-11: 2 g via INTRAVENOUS

## 2024-03-11 MED ORDER — POVIDONE-IODINE 10 % EX SWAB
2.0000 | Freq: Once | CUTANEOUS | Status: AC
  Administered 2024-03-11: 2 via TOPICAL

## 2024-03-11 MED ORDER — SUGAMMADEX SODIUM 200 MG/2ML IV SOLN
INTRAVENOUS | Status: DC | PRN
  Administered 2024-03-11: 200 mg via INTRAVENOUS

## 2024-03-11 MED ORDER — FENTANYL CITRATE (PF) 250 MCG/5ML IJ SOLN
INTRAMUSCULAR | Status: DC | PRN
  Administered 2024-03-11 (×2): 25 ug via INTRAVENOUS
  Administered 2024-03-11: 50 ug via INTRAVENOUS
  Administered 2024-03-11: 100 ug via INTRAVENOUS
  Administered 2024-03-11: 50 ug via INTRAVENOUS

## 2024-03-11 MED ORDER — ACETAMINOPHEN 500 MG PO TABS
1000.0000 mg | ORAL_TABLET | Freq: Once | ORAL | Status: AC
  Administered 2024-03-11: 1000 mg via ORAL

## 2024-03-11 MED ORDER — GABAPENTIN 300 MG PO CAPS
ORAL_CAPSULE | ORAL | Status: AC
  Filled 2024-03-11: qty 1

## 2024-03-11 MED ORDER — DEXAMETHASONE SOD PHOSPHATE PF 10 MG/ML IJ SOLN
INTRAMUSCULAR | Status: DC | PRN
  Administered 2024-03-11: 10 mg via INTRAVENOUS

## 2024-03-11 MED ORDER — LIDOCAINE 2% (20 MG/ML) 5 ML SYRINGE
INTRAMUSCULAR | Status: DC | PRN
  Administered 2024-03-11: 60 mg via INTRAVENOUS

## 2024-03-11 MED ORDER — MIDAZOLAM HCL (PF) 2 MG/2ML IJ SOLN
INTRAMUSCULAR | Status: DC | PRN
  Administered 2024-03-11: 2 mg via INTRAVENOUS

## 2024-03-11 MED ORDER — SODIUM CHLORIDE 0.9 % IR SOLN
Status: DC | PRN
  Administered 2024-03-11: 1000 mL

## 2024-03-11 MED ORDER — AMISULPRIDE (ANTIEMETIC) 5 MG/2ML IV SOLN: 10.0000 mg | Freq: Once | INTRAVENOUS | Status: DC | PRN

## 2024-03-11 MED ORDER — MIDAZOLAM HCL 2 MG/2ML IJ SOLN
INTRAMUSCULAR | Status: AC
  Filled 2024-03-11: qty 2

## 2024-03-11 MED ORDER — GABAPENTIN 300 MG PO CAPS
300.0000 mg | ORAL_CAPSULE | ORAL | Status: AC
  Administered 2024-03-11: 300 mg via ORAL

## 2024-03-11 MED ORDER — PHENYLEPHRINE 80 MCG/ML (10ML) SYRINGE FOR IV PUSH (FOR BLOOD PRESSURE SUPPORT)
PREFILLED_SYRINGE | INTRAVENOUS | Status: DC | PRN
  Administered 2024-03-11 (×3): 80 ug via INTRAVENOUS
  Administered 2024-03-11: 160 ug via INTRAVENOUS

## 2024-03-11 MED ORDER — MENTHOL 3 MG MT LOZG: 1.0000 | LOZENGE | OROMUCOSAL | Status: DC | PRN

## 2024-03-11 SURGICAL SUPPLY — 60 items
APPLICATOR COTTON TIP 6 STRL (MISCELLANEOUS) IMPLANT
CANNULA CAP OBTURATR AIRSEAL 8 (CAP) ×3 IMPLANT
CANNULA REDUCER 12-8 DVNC XI (CANNULA) IMPLANT
CATH FOLEY 3WAY 5CC 16FR (CATHETERS) ×3 IMPLANT
COVER BACK TABLE 60X90IN (DRAPES) ×3 IMPLANT
COVER MAYO STAND STRL (DRAPES) ×3 IMPLANT
COVER TIP SHEARS 8 DVNC (MISCELLANEOUS) ×3 IMPLANT
DEFOGGER SCOPE WARM SEASHARP (MISCELLANEOUS) ×3 IMPLANT
DERMABOND ADVANCED .7 DNX12 (GAUZE/BANDAGES/DRESSINGS) ×3 IMPLANT
DRAPE ARM DVNC X/XI (DISPOSABLE) ×12 IMPLANT
DRAPE COLUMN DVNC XI (DISPOSABLE) ×3 IMPLANT
DRAPE SURG IRRIG POUCH 19X23 (DRAPES) ×6 IMPLANT
DRAPE UTILITY XL STRL (DRAPES) ×3 IMPLANT
DRIVER NDL MEGA SUTCUT DVNCXI (INSTRUMENTS) ×3 IMPLANT
DRIVER NDLE MEGA SUTCUT DVNCXI (INSTRUMENTS) ×3 IMPLANT
DURAPREP 26ML APPLICATOR (WOUND CARE) ×3 IMPLANT
ELECTRODE REM PT RTRN 9FT ADLT (ELECTROSURGICAL) ×3 IMPLANT
FORCEPS BPLR FENES DVNC XI (FORCEP) ×3 IMPLANT
FORCEPS PROGRASP DVNC XI (FORCEP) IMPLANT
GAUZE 4X4 16PLY ~~LOC~~+RFID DBL (SPONGE) IMPLANT
GLOVE BIO SURGEON STRL SZ 6.5 (GLOVE) ×9 IMPLANT
GLOVE BIOGEL PI IND STRL 7.5 (GLOVE) IMPLANT
HOLDER FOLEY CATH W/STRAP (MISCELLANEOUS) IMPLANT
IRRIGATION SUCT STRKRFLW 2 WTP (MISCELLANEOUS) ×3 IMPLANT
KIT PINK PAD W/HEAD ARM REST (MISCELLANEOUS) ×3 IMPLANT
KIT TURNOVER KIT B (KITS) ×3 IMPLANT
LEGGING LITHOTOMY PAIR STRL (DRAPES) ×3 IMPLANT
MANIFOLD NEPTUNE II (INSTRUMENTS) ×3 IMPLANT
MANIPULATOR ADVINCU DEL 2.5 PL (MISCELLANEOUS) IMPLANT
MANIPULATOR ADVINCU DEL 3.0 PL (MISCELLANEOUS) IMPLANT
MANIPULATOR ADVINCU DEL 3.5 PL (MISCELLANEOUS) IMPLANT
MANIPULATOR ADVINCU DEL 4.0 PL (MISCELLANEOUS) IMPLANT
NDL INSUFFLATION 14GA 120MM (NEEDLE) ×3 IMPLANT
NEEDLE INSUFFLATION 14GA 120MM (NEEDLE) ×3 IMPLANT
NS IRRIG 1000ML POUR BTL (IV SOLUTION) IMPLANT
OBTURATOR OPTICALSTD 8 DVNC (TROCAR) ×3 IMPLANT
OCCLUDER COLPOPNEUMO (BALLOONS) IMPLANT
PACK ROBOT WH (CUSTOM PROCEDURE TRAY) ×3 IMPLANT
PACK ROBOTIC GOWN (GOWN DISPOSABLE) ×3 IMPLANT
PAD OB MATERNITY 11 LF (PERSONAL CARE ITEMS) ×3 IMPLANT
POWDER SURGICEL 3.0 GRAM (HEMOSTASIS) IMPLANT
SAVER CELL AAL HAEMONETICS (INSTRUMENTS) IMPLANT
SCISSORS LAP 5X35 DISP (ENDOMECHANICALS) IMPLANT
SCISSORS MNPLR CVD DVNC XI (INSTRUMENTS) ×3 IMPLANT
SEAL UNIV 5-12 XI (MISCELLANEOUS) ×6 IMPLANT
SEALER VESSEL EXT DVNC XI (MISCELLANEOUS) IMPLANT
SET IRRIG Y TYPE TUR BLADDER L (SET/KITS/TRAYS/PACK) IMPLANT
SET TUBE FILTERED XL AIRSEAL (SET/KITS/TRAYS/PACK) ×3 IMPLANT
SLEEVE SCD COMPRESS KNEE MED (STOCKING) ×3 IMPLANT
SOLN STERILE WATER BTL 1000 ML (IV SOLUTION) ×3 IMPLANT
SPIKE FLUID TRANSFER (MISCELLANEOUS) ×6 IMPLANT
SUT VIC AB 0 CT1 27XBRD ANBCTR (SUTURE) IMPLANT
SUT VIC AB 4-0 PS2 18 (SUTURE) ×3 IMPLANT
SUT VICRYL 0 UR6 27IN ABS (SUTURE) IMPLANT
SUT VLOC 180 0 9IN GS21 (SUTURE) ×3 IMPLANT
TIP ENDOSCOPIC SURGICEL (TIP) IMPLANT
TOWEL GREEN STERILE (TOWEL DISPOSABLE) ×3 IMPLANT
TRAY FOLEY W/BAG SLVR 14FR LF (SET/KITS/TRAYS/PACK) IMPLANT
TROCAR PORT AIRSEAL 8X100 (TROCAR) IMPLANT
UNDERPAD 30X36 HEAVY ABSORB (UNDERPADS AND DIAPERS) ×3 IMPLANT

## 2024-03-11 NOTE — Transfer of Care (Signed)
 Immediate Anesthesia Transfer of Care Note  Patient: Cheyenne [name]  Procedure(s) Performed: HYSTERECTOMY, TOTAL, ROBOT-ASSISTED, LAPAROSCOPIC, WITH BILATERAL SALPINGECTOMY (Bilateral: Abdomen) CYSTOSCOPY (Bladder) LYSIS, ADHESIONS, LAPAROSCOPIC (Abdomen)  Patient Location: PACU  Anesthesia Type:General  Level of Consciousness: awake, alert , oriented, and patient cooperative  Airway & Oxygen Therapy: Patient Spontanous Breathing and Patient connected to face mask oxygen  Post-op Assessment: Report given to RN and Post -op Vital signs reviewed and stable  Post vital signs: Reviewed and stable  Last Vitals:  Vitals Value Taken Time  BP    Temp 36.9 C 03/11/24 14:54  Pulse 86 03/11/24 14:55  Resp 24 03/11/24 14:55  SpO2 98 % 03/11/24 14:55  Vitals shown include unfiled device data.  Last Pain:  Vitals:   03/11/24 1006  TempSrc: Oral  PainSc: 0-No pain      Patients Stated Pain Goal: 5 (03/11/24 1006)  Complications: There were no known notable events for this encounter.

## 2024-03-11 NOTE — Progress Notes (Signed)
 Discharge instructions given to pt. Discussed post lap-hysterectomy care, signs and symptoms to report to the MD, upcoming appointments, and meds. Pt verbalizes understanding and has no questions or concerns at this time. Pt discharged home from hospital in stable condition.

## 2024-03-11 NOTE — Progress Notes (Signed)
*   Day of Surgery * Procedure(s) (LRB): HYSTERECTOMY, TOTAL, ROBOT-ASSISTED, LAPAROSCOPIC, WITH BILATERAL SALPINGECTOMY (Bilateral) CYSTOSCOPY (N/A) LYSIS, ADHESIONS, LAPAROSCOPIC  Subjective: Patient reports incisional pain, tolerating PO, and no problems voiding.  Some pelvic pressure.  Overall doing well, wants to go home  Objective: I have reviewed patient's vital signs, intake and output, medications, and labs.  General: alert and no distress Resp: clear to auscultation bilaterally Cardio: regular rate and rhythm GI: normal findings: bowel sounds normal and soft, non-tender Extremities: extremities normal, atraumatic, no cyanosis or edema Inc C/D/I  Assessment: s/p Procedure(s): HYSTERECTOMY, TOTAL, ROBOT-ASSISTED, LAPAROSCOPIC, WITH BILATERAL SALPINGECTOMY (Bilateral) CYSTOSCOPY (N/A) LYSIS, ADHESIONS, LAPAROSCOPIC: stable and progressing well  Plan: Encourage ambulation Discharge home when ambulating, voiding, tol po, pain controlled Has postop meds and f/u appt  LOS: 0 days    Ezzie Buba, MD 03/11/2024, 5:31 PM

## 2024-03-11 NOTE — Interval H&P Note (Signed)
 History and Physical Interval Note:  03/11/2024 11:24 AM  Cheyenne [name]  has presented today for surgery, with the diagnosis of abnormal uterine and vaginal bleeding.  The various methods of treatment have been discussed with the patient and family. After consideration of risks, benefits and other options for treatment, the patient has consented to  Procedure(s): HYSTERECTOMY, TOTAL, ROBOT-ASSISTED, LAPAROSCOPIC, WITH BILATERAL SALPINGO-OOPHORECTOMY (Bilateral) CYSTOSCOPY (N/A) as a surgical intervention.  The patient's history has been reviewed, patient examined, no change in status, stable for surgery.  I have reviewed the patient's chart and labs.  Questions were answered to the patient's satisfaction.    Pt aware awaiting pap results, d/w pt will have to be self pay - desires to proceed.   Caterina Racine Bovard-Stuckert

## 2024-03-11 NOTE — Anesthesia Preprocedure Evaluation (Signed)
 Anesthesia Evaluation  Patient identified by MRN, date of birth, ID band Patient awake    Reviewed: Allergy & Precautions, NPO status , Patient's Chart, lab work & pertinent test results  Airway Mallampati: III  TM Distance: >3 FB Neck ROM: Full    Dental  (+) Dental Advisory Given   Pulmonary sleep apnea and Continuous Positive Airway Pressure Ventilation , Current Smoker   breath sounds clear to auscultation       Cardiovascular hypertension, Pt. on medications +CHF   Rhythm:Regular Rate:Normal     Neuro/Psych negative neurological ROS     GI/Hepatic negative GI ROS, Neg liver ROS,,,  Endo/Other    Class 3 obesity  Renal/GU negative Renal ROS     Musculoskeletal  (+) Arthritis ,    Abdominal   Peds  Hematology negative hematology ROS (+)   Anesthesia Other Findings   Reproductive/Obstetrics                              Anesthesia Physical Anesthesia Plan  ASA: 3  Anesthesia Plan: General   Post-op Pain Management: Tylenol  PO (pre-op)*, Gabapentin PO (pre-op)* and Toradol IV (intra-op)*   Induction: Intravenous  PONV Risk Score and Plan: 3 and Midazolam, Dexamethasone, Ondansetron  and Treatment may vary due to age or medical condition  Airway Management Planned: Oral ETT  Additional Equipment:   Intra-op Plan:   Post-operative Plan: Extubation in OR  Informed Consent: I have reviewed the patients History and Physical, chart, labs and discussed the procedure including the risks, benefits and alternatives for the proposed anesthesia with the patient or authorized representative who has indicated his/her understanding and acceptance.     Dental advisory given  Plan Discussed with: CRNA  Anesthesia Plan Comments:         Anesthesia Quick Evaluation

## 2024-03-11 NOTE — Brief Op Note (Signed)
 03/11/2024  2:29 PM  PATIENT:  Cheyenne [name]  48 y.o. female  PRE-OPERATIVE DIAGNOSIS:  abnormal uterine bleeding, menorrhagia to anemia, adenomyosis  POST-OPERATIVE DIAGNOSIS:  abnormal uterine bleeding, menorrhagia to anemis, adenomyosis  PROCEDURE:  Procedure(s): HYSTERECTOMY, TOTAL, ROBOT-ASSISTED, LAPAROSCOPIC, WITH BILATERAL SALPINGECTOMY (Bilateral) CYSTOSCOPY (N/A) LYSIS, ADHESIONS, LAPAROSCOPIC  SURGEON:  Surgeons and Role:    * Bovard-Stuckert, Shannel Zahm, MD - Primary  ASSISTANTS: Gwenith Moats RNFA   ANESTHESIA:   local and general  EBL:  50 mL IVF and uop per anesthesia  DRAINS: none   LOCAL MEDICATIONS USED:  MARCAINE     SPECIMEN:  Source of Specimen:  uterus, cervix, B fallopian tubes  DISPOSITION OF SPECIMEN:  PATHOLOGY  COUNTS:  YES  TOURNIQUET:  * No tourniquets in log *  DICTATION: .Other Dictation: Dictation Number [id number]  PLAN OF CARE: extended recovery  PATIENT DISPOSITION:  PACU - hemodynamically stable.   Delay start of Pharmacological VTE agent (>24hrs) due to surgical blood loss or risk of bleeding: not applicable

## 2024-03-11 NOTE — Anesthesia Procedure Notes (Addendum)
 Procedure Name: Intubation Date/Time: 03/11/2024 12:50 PM  Performed by: Cena Epps, CRNAPre-anesthesia Checklist: Patient identified, Emergency Drugs available, Suction available and Patient being monitored Patient Re-evaluated:Patient Re-evaluated prior to induction Oxygen Delivery Method: Circle System Utilized Preoxygenation: Pre-oxygenation with 100% oxygen Induction Type: IV induction Ventilation: Mask ventilation without difficulty Laryngoscope Size: Mac and 3 Grade View: Grade I Tube type: Oral Tube size: 7.0 mm Number of attempts: 1 Airway Equipment and Method: Stylet and Oral airway Placement Confirmation: ETT inserted through vocal cords under direct vision, positive ETCO2 and breath sounds checked- equal and bilateral Secured at: 22 cm Tube secured with: Tape Dental Injury: Teeth and Oropharynx as per pre-operative assessment  Comments: Easy intubation , DL x 1 per Flossie Feeling, CRNA

## 2024-03-12 ENCOUNTER — Encounter (HOSPITAL_COMMUNITY): Payer: Self-pay | Admitting: Obstetrics and Gynecology

## 2024-03-12 NOTE — Op Note (Unsigned)
 Cheyenne [name], [name] MEDICAL RECORD NO: [id number] ACCOUNT NO: [id number] DATE OF BIRTH: Nov 11, 1975 FACILITY: [facility] LOCATION: [facility] PHYSICIAN: Ezzie Buba, MD  Operative Report   DATE OF PROCEDURE: 03/11/2024  PREOPERATIVE DIAGNOSES:  Abnormal uterine bleeding, menorrhagia to anemia and adenomyosis by ultrasound.  POSTOPERATIVE DIAGNOSES:  Abnormal uterine bleeding, menorrhagia to anemia and adenomyosis by ultrasound.  PROCEDURE:  Da Vinci robot-assisted total laparoscopic hysterectomy with bilateral salpingectomy, also laparoscopic lysis of adhesions, and cystoscopy.  SURGEON:  Ezzie Buba, MD  ASSISTANT:  Powell Hoops, RNFA  ANESTHESIA:  Local and general.  IV FLUIDS:  Per anesthesia.  URINE OUTPUT:  Per anesthesia.  ESTIMATED BLOOD LOSS:  50 mL  DESCRIPTION OF PROCEDURE:  After informed consent was reviewed with the patient and her daughter, she was transported back to the operating room and placed on the table in the supine position.  General anesthesia was induced and found to be adequate.   She was then placed in the [place] stirrups, prepped and draped in the normal sterile fashion.  After an appropriate timeout was performed, an open-sided speculum was used to visualize her cervix.  Advincula retractor was placed and a Foley catheter was  also placed.  The gloves and gown were changed.  Attention was turned to the abdominal portion of the case.  An approximately 8-mm supraumbilical incision was made.  The fascia was cleared off with a hemostat and using the Veress needle,  pneumoperitoneum was obtained with an opening pressure of 2 mmHg.  The port was placed under direct visualization.  Accessory ports were placed on both the right and left under direct visualization.  A brief pelvic survey revealed omental adhesion to anterior abdominal wall, normal uterus and  tubes, slightly enlarged ovaries.   Disposable scissors were connected to  electrocautery and this band of tissue was easily lysed.  The robot was then docked. The left tube was grasped at the fimbriated end.  A previous tubal ligation as a partial salpingectomy had been performed.  The tube was excised to the level of the cornu.  The uteroovarian ligament was ligated.  The  cardinal ligaments were ligated to the level of the uterine artery.  The bladder flap was created to the midline. Attention was then turned to the right side which was done in a similar fashion.  The fimbriated end of the tube was grasped and the tube  was excised to the level of the cornu.  The uteroovarian ligament was ligated.  The round ligament was ligated.  The cardinal ligaments were ligated to the level of the uterine artery.  The bladder flap was connected to the bladder flap created from the  left.  The Advincula ring was easily palpated.  The uterine artery was ligated.  Using hot scissors, a colpotomy was performed.  The uterus was delivered through the colpotomy incision through the vagina.  At this time, the patient was not tolerating steep  Trendelenburg. Therefore the steepness was reduced - this made her visualization more difficult, so an accessory port was placed to retract the bowel under direct visualization.  The uterine incision was closed with a suture of 0 V-Loc.  It was noted to be hemostatic.  The pedicles were all  hemostatic.  Irrigation was performed.  The ureters had been identified prior to the hysterectomy. However, we were unable to again identify due to the decrease in Trendelenburg.  Cystoscopy was performed.  Bilateral ureteral jets were seen.  The trocars  were removed, after pneumoperitoneum  was removed, the incisions were closed with 4-0 Vicryl and Dermabond.  The patient tolerated the procedure well.  Sponge, lap and needle counts were correct x2 per the operating room staff.   SHW D: 03/11/2024 2:44:50 pm T: 03/12/2024 12:45:00 am  JOB: [id number]/ [id number]

## 2024-03-13 LAB — SURGICAL PATHOLOGY

## 2024-03-13 NOTE — Anesthesia Postprocedure Evaluation (Signed)
 Anesthesia Post Note  Patient: Cheyenne [name]  Procedure(s) Performed: HYSTERECTOMY, TOTAL, ROBOT-ASSISTED, LAPAROSCOPIC, WITH BILATERAL SALPINGECTOMY (Bilateral: Abdomen) CYSTOSCOPY (Bladder) LYSIS, ADHESIONS, LAPAROSCOPIC (Abdomen)     Patient location during evaluation: PACU Anesthesia Type: General Level of consciousness: awake and alert Pain management: pain level controlled Vital Signs Assessment: post-procedure vital signs reviewed and stable Respiratory status: spontaneous breathing, nonlabored ventilation, respiratory function stable and patient connected to nasal cannula oxygen Cardiovascular status: blood pressure returned to baseline and stable Postop Assessment: no apparent nausea or vomiting Anesthetic complications: no   There were no known notable events for this encounter.  Last Vitals:  Vitals:   03/11/24 1615 03/11/24 1655  BP: (!) 129/91 125/80  Pulse: 85 85  Resp: 14 16  Temp:  36.8 C  SpO2: 94% 96%    Last Pain:  Vitals:   03/11/24 1714  TempSrc:   PainSc: 5                  Epifanio Lamar BRAVO
